# Patient Record
Sex: Male | Born: 1960 | Race: Black or African American | Hispanic: No | Smoking: Current every day smoker
Health system: Southern US, Community
[De-identification: ages and names within clinical notes are randomized; demographics above are authoritative.]

## PROBLEM LIST (undated history)

## (undated) DIAGNOSIS — F1010 Alcohol abuse, uncomplicated: Secondary | ICD-10-CM

## (undated) DIAGNOSIS — I10 Essential (primary) hypertension: Secondary | ICD-10-CM

## (undated) DIAGNOSIS — M1990 Unspecified osteoarthritis, unspecified site: Secondary | ICD-10-CM

## (undated) DIAGNOSIS — S42202A Unspecified fracture of upper end of left humerus, initial encounter for closed fracture: Secondary | ICD-10-CM

## (undated) DIAGNOSIS — T7840XA Allergy, unspecified, initial encounter: Secondary | ICD-10-CM

## (undated) DIAGNOSIS — K219 Gastro-esophageal reflux disease without esophagitis: Secondary | ICD-10-CM

## (undated) HISTORY — DX: Essential (primary) hypertension: I10

## (undated) HISTORY — PX: COLONOSCOPY: SHX174

## (undated) HISTORY — DX: Alcohol abuse, uncomplicated: F10.10

## (undated) HISTORY — DX: Allergy, unspecified, initial encounter: T78.40XA

## (undated) HISTORY — PX: MULTIPLE TOOTH EXTRACTIONS: SHX2053

---

## 2005-03-14 ENCOUNTER — Emergency Department (HOSPITAL_COMMUNITY): Admission: EM | Admit: 2005-03-14 | Discharge: 2005-03-14 | Payer: Self-pay | Admitting: Emergency Medicine

## 2010-11-18 ENCOUNTER — Inpatient Hospital Stay (HOSPITAL_COMMUNITY)
Admission: EM | Admit: 2010-11-18 | Discharge: 2010-11-20 | DRG: 552 | Disposition: A | Discharge status: Home Health | Payer: Self-pay | Attending: Internal Medicine | Admitting: Internal Medicine

## 2010-11-18 ENCOUNTER — Emergency Department (HOSPITAL_COMMUNITY): Payer: Self-pay

## 2010-11-18 DIAGNOSIS — R209 Unspecified disturbances of skin sensation: Secondary | ICD-10-CM | POA: Diagnosis present

## 2010-11-18 DIAGNOSIS — F17200 Nicotine dependence, unspecified, uncomplicated: Secondary | ICD-10-CM | POA: Diagnosis present

## 2010-11-18 DIAGNOSIS — M47812 Spondylosis without myelopathy or radiculopathy, cervical region: Principal | ICD-10-CM | POA: Diagnosis present

## 2010-11-18 DIAGNOSIS — E876 Hypokalemia: Secondary | ICD-10-CM | POA: Diagnosis present

## 2010-11-18 DIAGNOSIS — F1020 Alcohol dependence, uncomplicated: Secondary | ICD-10-CM | POA: Diagnosis present

## 2010-11-18 LAB — DIFFERENTIAL
Eosinophils Absolute: 0 10*3/uL (ref 0.0–0.7)
Eosinophils Relative: 0 % (ref 0–5)
Lymphocytes Relative: 32 % (ref 12–46)
Lymphocytes Relative: 45 % (ref 12–46)
Lymphs Abs: 1.4 10*3/uL (ref 0.7–4.0)
Lymphs Abs: 2.2 10*3/uL (ref 0.7–4.0)
Neutro Abs: 2.4 10*3/uL (ref 1.7–7.7)
Neutrophils Relative %: 49 % (ref 43–77)
Neutrophils Relative %: 63 % (ref 43–77)

## 2010-11-18 LAB — CBC
HCT: 38.3 % — ABNORMAL LOW (ref 39.0–52.0)
HCT: 39.7 % (ref 39.0–52.0)
Hemoglobin: 13.7 g/dL (ref 13.0–17.0)
MCV: 90.1 fL (ref 78.0–100.0)
MCV: 90.2 fL (ref 78.0–100.0)
Platelets: 223 10*3/uL (ref 150–400)
RBC: 4.25 MIL/uL (ref 4.22–5.81)
RBC: 4.4 MIL/uL (ref 4.22–5.81)
WBC: 4.3 10*3/uL (ref 4.0–10.5)
WBC: 4.9 10*3/uL (ref 4.0–10.5)

## 2010-11-18 LAB — BASIC METABOLIC PANEL
GFR calc non Af Amer: >60 mL/min (ref >60)
Glucose, Bld: 99 mg/dL (ref 70–99)
Potassium: 4.2 mEq/L (ref 3.5–5.1)
Sodium: 138 mEq/L (ref 135–145)

## 2010-11-19 ENCOUNTER — Inpatient Hospital Stay (HOSPITAL_COMMUNITY): Payer: Self-pay

## 2010-11-19 LAB — PROTIME-INR
INR: 0.89 (ref 0.00–1.49)
Prothrombin Time: 12.3 seconds (ref 11.6–15.2)

## 2010-11-19 LAB — RAPID URINE DRUG SCREEN, HOSP PERFORMED
Amphetamines: NOT DETECTED
Barbiturates: NOT DETECTED
Benzodiazepines: NOT DETECTED
Opiates: NOT DETECTED

## 2010-11-19 LAB — GLUCOSE, CAPILLARY
Glucose-Capillary: 95 mg/dL (ref 70–99)
Glucose-Capillary: 121 mg/dL — ABNORMAL HIGH (ref 70–99)

## 2010-11-19 LAB — COMPREHENSIVE METABOLIC PANEL
ALT: 30 U/L (ref 0–53)
AST: 65 U/L — ABNORMAL HIGH (ref 0–37)
Albumin: 3.9 g/dL (ref 3.5–5.2)
Alkaline Phosphatase: 60 U/L (ref 39–117)
BUN: 4 mg/dL — ABNORMAL LOW (ref 6–23)
CO2: 25 mEq/L (ref 19–32)
Calcium: 8.5 mg/dL (ref 8.4–10.5)
Chloride: 105 mEq/L (ref 96–112)
Creatinine, Ser: 0.81 mg/dL (ref 0.4–1.5)
GFR calc Af Amer: >60 mL/min (ref >60)
GFR calc non Af Amer: >60 mL/min (ref >60)
Glucose, Bld: 77 mg/dL (ref 70–99)
Glucose, Bld: 81 mg/dL (ref 70–99)
Sodium: 141 mEq/L (ref 135–145)
Total Bilirubin: 0.5 mg/dL (ref 0.3–1.2)
Total Bilirubin: 0.7 mg/dL (ref 0.3–1.2)
Total Protein: 6.8 g/dL (ref 6.0–8.3)

## 2010-11-19 LAB — URINALYSIS, ROUTINE W REFLEX MICROSCOPIC
Bilirubin Urine: NEGATIVE
Glucose, UA: NEGATIVE mg/dL
Hgb urine dipstick: NEGATIVE
Specific Gravity, Urine: 1.009 (ref 1.005–1.030)
pH: 5.5 (ref 5.0–8.0)

## 2010-11-19 LAB — HEMOGLOBIN A1C
Hgb A1c MFr Bld: 5.3 % (ref <5.7)
Mean Plasma Glucose: 105 mg/dL (ref <117)

## 2010-11-19 LAB — CK TOTAL AND CKMB (NOT AT ARMC)
CK, MB: 1.6 ng/mL (ref 0.3–4.0)
Relative Index: 1 (ref 0.0–2.5)

## 2010-11-19 LAB — CBC
HCT: 35.2 % — ABNORMAL LOW (ref 39.0–52.0)
Hemoglobin: 12.1 g/dL — ABNORMAL LOW (ref 13.0–17.0)
MCHC: 34.4 g/dL (ref 30.0–36.0)
RBC: 3.87 MIL/uL — ABNORMAL LOW (ref 4.22–5.81)

## 2010-11-19 LAB — TROPONIN I: Troponin I: 0.02 ng/mL (ref 0.00–0.06)

## 2010-11-19 LAB — LIPID PANEL
Cholesterol: 196 mg/dL (ref 0–200)
HDL: 133 mg/dL (ref >39)

## 2010-11-19 LAB — APTT: aPTT: 25 seconds (ref 24–37)

## 2010-11-19 MED ORDER — GADOBENATE DIMEGLUMINE 529 MG/ML IV SOLN
15.0000 mL | Freq: Once | INTRAVENOUS | Status: AC | PRN
  Administered 2010-11-19: 15 mL via INTRAVENOUS

## 2010-11-20 DIAGNOSIS — R209 Unspecified disturbances of skin sensation: Secondary | ICD-10-CM

## 2010-11-20 LAB — BASIC METABOLIC PANEL
CO2: 28 mEq/L (ref 19–32)
Calcium: 9.1 mg/dL (ref 8.4–10.5)
Chloride: 104 mEq/L (ref 96–112)
Creatinine, Ser: 0.86 mg/dL (ref 0.4–1.5)
GFR calc Af Amer: >60 mL/min (ref >60)
Glucose, Bld: 87 mg/dL (ref 70–99)
Sodium: 137 mEq/L (ref 135–145)

## 2010-11-20 LAB — GLUCOSE, CAPILLARY: Glucose-Capillary: 87 mg/dL (ref 70–99)

## 2010-11-28 NOTE — H&P (Signed)
NAME:  Dan Clark, Dan Clark              ACCOUNT NO.:  [id number]  MEDICAL RECORD NO.:  [id number]           PATIENT TYPE:  I  LOCATION:  3005                         FACILITY:  [facility]  PHYSICIAN:  Lonia Blood, M.D.      DATE OF BIRTH:  07-28-1961  DATE OF ADMISSION:  11/18/2010 DATE OF DISCHARGE:                             HISTORY & PHYSICAL   PRIMARY CARE PHYSICIAN:  Unassigned.  PRESENTING COMPLAINT:  Left upper extremity weakness.  HISTORY OF PRESENT ILLNESS:  The patient is a 50 year old gentleman with no significant past medical history but he is a heavy drinker and alcoholic who apparently was doing fine until this evening when he was at home.  He was drinking and talking to his wife.  He suddenly had weakness in his left upper extremity.  Per patient, he was unable to reach or hold his back hand.  He tried but felt numb and weak.  His wife was worried and called EMS.  He denied any symptoms of weakness in other areas, slurred speech, headache, fever, or nausea.  He denied any prior history of such.  He denied any injury.  He takes about a couple of 40s and some liquor every day,so he is a heavy alcoholic.  He also smokes at least half-a-pack a day.  The patient has not been on any medication and denied any known family history of stroke.  PAST MEDICAL HISTORY:  None.  ALLERGIES:  No known drug allergies.  MEDICATIONS:  None.  SOCIAL HISTORY:  He is married, lives with his wife.  He drinks excessively at least a couple of 40s every single day and some liquor. He also smokes about 3 cigarettes to half-pack per day.  Denied any IV drug use.  Denied any other drugs.  FAMILY HISTORY:  The patient reports hypertension running in his family and some diabetics remotely.  There is also a history of breast cancer in his mother.  REVIEW OF SYSTEMS:  All system review are negative except per HPI.  PHYSICAL EXAMINATION:  VITAL SIGNS:  Temperature 98.0 orally, blood pressure  139/100, pulse 75, respiratory rate 18, and sats 99% on room air. GENERAL:  He is awake, alert, and oriented.  He is in no acute distress. HEENT:  PERRL.  EOMI.  The patient is slightly slurred and looks slightly intoxicated. NECK:  Supple.  No JVD, no lymphadenopathy. RESPIRATORY:  He has good air entry bilaterally.  No wheezes and no rales. CARDIOVASCULAR SYSTEM:  He has S1-S2.  No murmur. ABDOMEN:  Soft, full, nontender with positive bowel sounds. EXTREMITIES:  No edema, cyanosis, or clubbing. NEUROLOGIC:  Left upper extremity seems to have isolated radial nerve area weakness.  The patient is unable to extend his wrist.  He has poor flexion but able to flex but not strong enough.  Power in those motors is 3/5.  He is able to raise his hand.  There is mild pronator drift. No corresponding lower extremity weakness.  Slight tenderness in the cervical area.  LABORATORY DATA:  Sodium 138, potassium 4.2, chloride 102, CO2 27, glucose 99, BUN 4, creatinine 0.88, and calcium 8.9.  White count is 4.3, hemoglobin 14.2, platelet count 223 with normal differentials.  ASSESSMENT:  This is a 50 year old gentleman who is a heavy alcoholic and tobacco abuser presenting with isolated left upper extremity weakness.  I suspect this is more likely to be radiculopathy.  The patient denied sleeping in the chair and the symptoms are not consistent with "Saturday night palsy" which is prevalent among alcoholics.  It seems more likely that this is an isolated radial nerve type palsy but could also be a focal cerebrovascular accident.  PLAN: 1. Left lower extremity weakness.  We will admit the patient for     workup.  I will check MRI of the brain and MRA.  I will check also     a MRI of the cervical spine to look for isolated radiculopathy.  Of     note, his reflexes are down 1+ in the left upper extremity which     may reflect radiculopathy rather than central CVA.  We will also     work him up as if  he had a CVA including 2-D echo.  We will get     PT/OT to see the patient and get Neurology consult. 2. Alcohol intoxication.  The patient is a chronic alcoholic.  I will     put him thiamine and folate.  We will watch him out for alcohol     withdrawal.  We will start with the CIWA protocol. 3. Tobacco abuse.  I will start nicotine patch on the patient and     follow him closely.     Lonia Blood, M.D.     Verlin Grills  D:  11/19/2010  T:  11/19/2010  Job:  [id number]  Electronically Signed by Lonia Blood M.D. on 11/28/2010 04:07:56 PM

## 2017-03-23 ENCOUNTER — Emergency Department (HOSPITAL_COMMUNITY)
Admission: EM | Admit: 2017-03-23 | Discharge: 2017-03-23 | Disposition: A | Discharge status: Home / Self Care | Payer: Self-pay | Attending: Emergency Medicine | Admitting: Emergency Medicine

## 2017-03-23 ENCOUNTER — Emergency Department (HOSPITAL_COMMUNITY): Payer: Self-pay

## 2017-03-23 DIAGNOSIS — Y998 Other external cause status: Secondary | ICD-10-CM | POA: Insufficient documentation

## 2017-03-23 DIAGNOSIS — W1789XA Other fall from one level to another, initial encounter: Secondary | ICD-10-CM | POA: Insufficient documentation

## 2017-03-23 DIAGNOSIS — Y939 Activity, unspecified: Secondary | ICD-10-CM | POA: Insufficient documentation

## 2017-03-23 DIAGNOSIS — M25512 Pain in left shoulder: Secondary | ICD-10-CM | POA: Insufficient documentation

## 2017-03-23 DIAGNOSIS — S42215A Unspecified nondisplaced fracture of surgical neck of left humerus, initial encounter for closed fracture: Secondary | ICD-10-CM | POA: Insufficient documentation

## 2017-03-23 DIAGNOSIS — Y929 Unspecified place or not applicable: Secondary | ICD-10-CM | POA: Insufficient documentation

## 2017-03-23 MED ORDER — MORPHINE SULFATE (PF) 4 MG/ML IV SOLN
4.0000 mg | Freq: Once | INTRAVENOUS | Status: AC
  Administered 2017-03-23: 4 mg via INTRAVENOUS
  Filled 2017-03-23: qty 1

## 2017-03-23 MED ORDER — HYDROCODONE-ACETAMINOPHEN 5-325 MG PO TABS: 1.0000 | ORAL_TABLET | Freq: Four times a day (QID) | ORAL | 0 refills | Status: DC | PRN

## 2017-03-23 NOTE — Discharge Instructions (Signed)
Please take Ibuprofen (Advil, motrin) and Tylenol (acetaminophen) to relieve your pain.  You may take up to 600 MG (3 pills) of normal strength ibuprofen every 8 hours as needed.  In between doses of ibuprofen you make take tylenol, up to 1,000 mg (two extra strength pills).  Do not take more than 3,000 mg tylenol in a 24 hour period.  Please check all medication labels as many medications such as pain and cold medications may contain tylenol.  Your prescription pain medication contains 325 mg of tylenol which you need to count in your daily allowance.  Do not drink alcohol while taking these medications.  Do not take other NSAID'S while taking ibuprofen (such as aleve or naproxen).  Please take ibuprofen with food to decrease stomach upset.  Today you received medications that may make you sleepy or impair your ability to make decisions.  For the next 24 hours please do not drive, operate heavy machinery, care for a small child with out another adult present, or perform any activities that may cause harm to you or someone else if you were to fall asleep or be impaired.   You are being prescribed a medication which may make you sleepy. Please follow up of listed precautions for at least 24 hours after taking one dose.  It is important that you do not drink while taking this medication as mixing alcohol with pain medication may slow your breathing and may make you stop breathing. Ibuprofen and tylenol should be your main pain control and the prescription medication is only for pain that is not controlled by the ibuprofen and tylenol.   While in the ED your blood pressure was high.  Please follow up with your primary care doctor or the wellness clinic for repeat evaluation as you may need medication.  High blood pressure can cause long term, potentially serious, damage if left untreated.
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
## 2017-03-23 NOTE — ED Notes (Signed)
Waiting for ortho 

## 2017-03-23 NOTE — ED Notes (Signed)
Patient transported to X-ray 

## 2017-03-23 NOTE — Progress Notes (Signed)
Orthopedic Tech Progress Note Patient Details:  Dan Clark 09/11/1960 [id number]  Ortho Devices Type of Ortho Device: Sling immobilizer Ortho Device/Splint Interventions: Application   [name] 03/23/2017, 12:21 PM
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
## 2017-03-25 ENCOUNTER — Other Ambulatory Visit: Payer: Self-pay | Admitting: Orthopedic Surgery

## 2017-03-28 ENCOUNTER — Ambulatory Visit (HOSPITAL_COMMUNITY): Payer: Self-pay

## 2017-03-28 ENCOUNTER — Ambulatory Visit (HOSPITAL_COMMUNITY): Payer: Self-pay | Admitting: Anesthesiology

## 2017-03-28 ENCOUNTER — Observation Stay (HOSPITAL_COMMUNITY): Payer: Self-pay

## 2017-03-28 ENCOUNTER — Encounter (HOSPITAL_COMMUNITY): Payer: Self-pay | Admitting: *Deleted

## 2017-03-28 ENCOUNTER — Encounter (HOSPITAL_COMMUNITY)
Admission: RE | Disposition: A | Discharge status: Home / Self Care | Payer: Self-pay | Source: Ambulatory Visit | Attending: Orthopedic Surgery

## 2017-03-28 ENCOUNTER — Observation Stay (HOSPITAL_COMMUNITY)
Admission: RE | Admit: 2017-03-28 | Discharge: 2017-03-29 | Disposition: A | Discharge status: Home / Self Care | Payer: Self-pay | Source: Ambulatory Visit | Attending: Orthopedic Surgery | Admitting: Orthopedic Surgery

## 2017-03-28 DIAGNOSIS — Z8781 Personal history of (healed) traumatic fracture: Secondary | ICD-10-CM

## 2017-03-28 DIAGNOSIS — M1990 Unspecified osteoarthritis, unspecified site: Secondary | ICD-10-CM | POA: Insufficient documentation

## 2017-03-28 DIAGNOSIS — Z419 Encounter for procedure for purposes other than remedying health state, unspecified: Secondary | ICD-10-CM

## 2017-03-28 DIAGNOSIS — Y9389 Activity, other specified: Secondary | ICD-10-CM | POA: Insufficient documentation

## 2017-03-28 DIAGNOSIS — F17200 Nicotine dependence, unspecified, uncomplicated: Secondary | ICD-10-CM | POA: Insufficient documentation

## 2017-03-28 DIAGNOSIS — K219 Gastro-esophageal reflux disease without esophagitis: Secondary | ICD-10-CM | POA: Insufficient documentation

## 2017-03-28 DIAGNOSIS — Z9889 Other specified postprocedural states: Secondary | ICD-10-CM

## 2017-03-28 DIAGNOSIS — S42292A Other displaced fracture of upper end of left humerus, initial encounter for closed fracture: Principal | ICD-10-CM | POA: Insufficient documentation

## 2017-03-28 DIAGNOSIS — S42202A Unspecified fracture of upper end of left humerus, initial encounter for closed fracture: Secondary | ICD-10-CM | POA: Diagnosis present

## 2017-03-28 HISTORY — DX: Gastro-esophageal reflux disease without esophagitis: K21.9

## 2017-03-28 HISTORY — PX: ORIF HUMERUS FRACTURE: SHX2126

## 2017-03-28 HISTORY — DX: Unspecified osteoarthritis, unspecified site: M19.90

## 2017-03-28 HISTORY — DX: Unspecified fracture of upper end of left humerus, initial encounter for closed fracture: S42.202A

## 2017-03-28 LAB — CBC
HCT: 33.9 % — ABNORMAL LOW (ref 39.0–52.0)
HEMOGLOBIN: 11.6 g/dL — AB (ref 13.0–17.0)
MCH: 31.6 pg (ref 26.0–34.0)
MCHC: 34.2 g/dL (ref 30.0–36.0)
MCV: 92.4 fL (ref 78.0–100.0)
Platelets: 268 10*3/uL (ref 150–400)
RBC: 3.67 MIL/uL — AB (ref 4.22–5.81)
RDW: 13.4 % (ref 11.5–15.5)
WBC: 6.9 10*3/uL (ref 4.0–10.5)

## 2017-03-28 LAB — BASIC METABOLIC PANEL
ANION GAP: 11 (ref 5–15)
BUN: <5 mg/dL — ABNORMAL LOW (ref 6–20)
CO2: 28 mmol/L (ref 22–32)
Calcium: 9.2 mg/dL (ref 8.9–10.3)
Chloride: 96 mmol/L — ABNORMAL LOW (ref 101–111)
Creatinine, Ser: 0.93 mg/dL (ref 0.61–1.24)
Glucose, Bld: 110 mg/dL — ABNORMAL HIGH (ref 65–99)
POTASSIUM: 3.2 mmol/L — AB (ref 3.5–5.1)
SODIUM: 135 mmol/L (ref 135–145)

## 2017-03-28 LAB — SURGICAL PCR SCREEN
MRSA, PCR: NEGATIVE
STAPHYLOCOCCUS AUREUS: NEGATIVE

## 2017-03-28 SURGERY — OPEN REDUCTION INTERNAL FIXATION (ORIF) PROXIMAL HUMERUS FRACTURE
Anesthesia: General | Laterality: Left

## 2017-03-28 MED ORDER — ZOLPIDEM TARTRATE 5 MG PO TABS
5.0000 mg | ORAL_TABLET | Freq: Every evening | ORAL | Status: DC | PRN
  Administered 2017-03-28: 5 mg via ORAL
  Filled 2017-03-28: qty 1

## 2017-03-28 MED ORDER — MENTHOL 3 MG MT LOZG: 1.0000 | LOZENGE | OROMUCOSAL | Status: DC | PRN

## 2017-03-28 MED ORDER — FENTANYL CITRATE (PF) 250 MCG/5ML IJ SOLN
INTRAMUSCULAR | Status: AC
  Filled 2017-03-28: qty 5

## 2017-03-28 MED ORDER — PHENYLEPHRINE 40 MCG/ML (10ML) SYRINGE FOR IV PUSH (FOR BLOOD PRESSURE SUPPORT)
PREFILLED_SYRINGE | INTRAVENOUS | Status: AC
  Filled 2017-03-28: qty 10

## 2017-03-28 MED ORDER — OXYCODONE HCL 5 MG PO TABS
5.0000 mg | ORAL_TABLET | ORAL | Status: DC | PRN
  Administered 2017-03-28 – 2017-03-29 (×4): 10 mg via ORAL
  Filled 2017-03-28 (×4): qty 2

## 2017-03-28 MED ORDER — METOCLOPRAMIDE HCL 5 MG/ML IJ SOLN: 5.0000 mg | Freq: Three times a day (TID) | INTRAMUSCULAR | Status: DC | PRN

## 2017-03-28 MED ORDER — ONDANSETRON HCL 4 MG/2ML IJ SOLN
INTRAMUSCULAR | Status: AC
  Filled 2017-03-28: qty 2

## 2017-03-28 MED ORDER — CEFAZOLIN SODIUM-DEXTROSE 1-4 GM/50ML-% IV SOLN
1.0000 g | Freq: Four times a day (QID) | INTRAVENOUS | Status: AC
  Administered 2017-03-28 – 2017-03-29 (×3): 1 g via INTRAVENOUS
  Filled 2017-03-28 (×3): qty 50

## 2017-03-28 MED ORDER — PHENYLEPHRINE HCL 10 MG/ML IJ SOLN
INTRAMUSCULAR | Status: DC | PRN
  Administered 2017-03-28 (×7): 80 ug via INTRAVENOUS

## 2017-03-28 MED ORDER — ONDANSETRON HCL 4 MG/2ML IJ SOLN: 4.0000 mg | Freq: Four times a day (QID) | INTRAMUSCULAR | Status: DC | PRN

## 2017-03-28 MED ORDER — DIPHENHYDRAMINE HCL 12.5 MG/5ML PO ELIX: 12.5000 mg | ORAL_SOLUTION | ORAL | Status: DC | PRN

## 2017-03-28 MED ORDER — LACTATED RINGERS IV SOLN
INTRAVENOUS | Status: DC
  Administered 2017-03-28: 13:00:00 via INTRAVENOUS

## 2017-03-28 MED ORDER — FENTANYL CITRATE (PF) 100 MCG/2ML IJ SOLN
INTRAMUSCULAR | Status: DC | PRN
  Administered 2017-03-28: 100 ug via INTRAVENOUS
  Administered 2017-03-28 (×3): 50 ug via INTRAVENOUS

## 2017-03-28 MED ORDER — HYDROMORPHONE HCL 1 MG/ML IJ SOLN
0.5000 mg | INTRAMUSCULAR | Status: DC | PRN
  Administered 2017-03-29 (×2): 0.5 mg via INTRAVENOUS
  Filled 2017-03-28 (×2): qty 1

## 2017-03-28 MED ORDER — BACLOFEN 10 MG PO TABS: 10.0000 mg | ORAL_TABLET | Freq: Three times a day (TID) | ORAL | 0 refills | Status: DC

## 2017-03-28 MED ORDER — ROCURONIUM BROMIDE 100 MG/10ML IV SOLN
INTRAVENOUS | Status: DC | PRN
  Administered 2017-03-28: 50 mg via INTRAVENOUS

## 2017-03-28 MED ORDER — SENNA-DOCUSATE SODIUM 8.6-50 MG PO TABS: 2.0000 | ORAL_TABLET | Freq: Every day | ORAL | 1 refills | Status: DC

## 2017-03-28 MED ORDER — POLYETHYLENE GLYCOL 3350 17 G PO PACK: 17.0000 g | PACK | Freq: Every day | ORAL | Status: DC | PRN

## 2017-03-28 MED ORDER — LACTATED RINGERS IV SOLN
INTRAVENOUS | Status: DC | PRN
  Administered 2017-03-28 (×3): via INTRAVENOUS

## 2017-03-28 MED ORDER — OXYCODONE HCL 5 MG PO TABS: 5.0000 mg | ORAL_TABLET | Freq: Once | ORAL | Status: DC | PRN

## 2017-03-28 MED ORDER — BISACODYL 10 MG RE SUPP: 10.0000 mg | Freq: Every day | RECTAL | Status: DC | PRN

## 2017-03-28 MED ORDER — MIDAZOLAM HCL 2 MG/2ML IJ SOLN
INTRAMUSCULAR | Status: AC
  Administered 2017-03-28: 2 mg via INTRAVENOUS
  Filled 2017-03-28: qty 2

## 2017-03-28 MED ORDER — HYDROMORPHONE HCL 1 MG/ML IJ SOLN
0.2500 mg | INTRAMUSCULAR | Status: DC | PRN
  Administered 2017-03-28 (×2): 0.5 mg via INTRAVENOUS

## 2017-03-28 MED ORDER — FENTANYL CITRATE (PF) 100 MCG/2ML IJ SOLN
INTRAMUSCULAR | Status: AC
  Administered 2017-03-28: 50 ug via INTRAVENOUS
  Filled 2017-03-28: qty 2

## 2017-03-28 MED ORDER — BUPIVACAINE-EPINEPHRINE (PF) 0.5% -1:200000 IJ SOLN
INTRAMUSCULAR | Status: DC | PRN
  Administered 2017-03-28: 30 mL via PERINEURAL

## 2017-03-28 MED ORDER — LABETALOL HCL 5 MG/ML IV SOLN
INTRAVENOUS | Status: AC
  Filled 2017-03-28: qty 4

## 2017-03-28 MED ORDER — ONDANSETRON HCL 4 MG/2ML IJ SOLN
INTRAMUSCULAR | Status: DC | PRN
  Administered 2017-03-28: 4 mg via INTRAVENOUS

## 2017-03-28 MED ORDER — 0.9 % SODIUM CHLORIDE (POUR BTL) OPTIME
TOPICAL | Status: DC | PRN
  Administered 2017-03-28: 1000 mL

## 2017-03-28 MED ORDER — METOCLOPRAMIDE HCL 5 MG PO TABS: 5.0000 mg | ORAL_TABLET | Freq: Three times a day (TID) | ORAL | Status: DC | PRN

## 2017-03-28 MED ORDER — POVIDONE-IODINE 10 % EX SWAB: 2.0000 "application " | Freq: Once | CUTANEOUS | Status: DC

## 2017-03-28 MED ORDER — CEFAZOLIN SODIUM-DEXTROSE 2-4 GM/100ML-% IV SOLN
2.0000 g | INTRAVENOUS | Status: AC
  Administered 2017-03-28: 2 g via INTRAVENOUS
  Filled 2017-03-28: qty 100

## 2017-03-28 MED ORDER — LIDOCAINE HCL (CARDIAC) 20 MG/ML IV SOLN
INTRAVENOUS | Status: DC | PRN
  Administered 2017-03-28: 60 mg via INTRAVENOUS

## 2017-03-28 MED ORDER — HYDROMORPHONE HCL 1 MG/ML IJ SOLN
INTRAMUSCULAR | Status: AC
  Filled 2017-03-28: qty 1

## 2017-03-28 MED ORDER — PROPOFOL 10 MG/ML IV BOLUS
INTRAVENOUS | Status: DC | PRN
  Administered 2017-03-28: 150 mg via INTRAVENOUS

## 2017-03-28 MED ORDER — ACETAMINOPHEN 325 MG PO TABS: 650.0000 mg | ORAL_TABLET | Freq: Four times a day (QID) | ORAL | Status: DC | PRN

## 2017-03-28 MED ORDER — MAGNESIUM CITRATE PO SOLN: 1.0000 | Freq: Once | ORAL | Status: DC | PRN

## 2017-03-28 MED ORDER — PHENOL 1.4 % MT LIQD: 1.0000 | OROMUCOSAL | Status: DC | PRN

## 2017-03-28 MED ORDER — DOCUSATE SODIUM 100 MG PO CAPS
100.0000 mg | ORAL_CAPSULE | Freq: Two times a day (BID) | ORAL | Status: DC
  Administered 2017-03-28 – 2017-03-29 (×2): 100 mg via ORAL
  Filled 2017-03-28 (×2): qty 1

## 2017-03-28 MED ORDER — OXYCODONE HCL 5 MG PO TABS: 5.0000 mg | ORAL_TABLET | ORAL | 0 refills | Status: DC | PRN

## 2017-03-28 MED ORDER — MIDAZOLAM HCL 2 MG/2ML IJ SOLN
2.0000 mg | Freq: Once | INTRAMUSCULAR | Status: AC
  Administered 2017-03-28: 2 mg via INTRAVENOUS

## 2017-03-28 MED ORDER — METHOCARBAMOL 500 MG PO TABS
ORAL_TABLET | ORAL | Status: AC
  Filled 2017-03-28: qty 1

## 2017-03-28 MED ORDER — FENTANYL CITRATE (PF) 100 MCG/2ML IJ SOLN
50.0000 ug | Freq: Once | INTRAMUSCULAR | Status: AC
  Administered 2017-03-28: 50 ug via INTRAVENOUS

## 2017-03-28 MED ORDER — METHOCARBAMOL 500 MG PO TABS
500.0000 mg | ORAL_TABLET | Freq: Four times a day (QID) | ORAL | Status: DC | PRN
  Administered 2017-03-28 – 2017-03-29 (×3): 500 mg via ORAL
  Filled 2017-03-28 (×2): qty 1

## 2017-03-28 MED ORDER — OXYCODONE HCL 5 MG/5ML PO SOLN: 5.0000 mg | Freq: Once | ORAL | Status: DC | PRN

## 2017-03-28 MED ORDER — SENNA 8.6 MG PO TABS
1.0000 | ORAL_TABLET | Freq: Two times a day (BID) | ORAL | Status: DC
  Administered 2017-03-28 – 2017-03-29 (×2): 8.6 mg via ORAL
  Filled 2017-03-28 (×2): qty 1

## 2017-03-28 MED ORDER — SUGAMMADEX SODIUM 200 MG/2ML IV SOLN
INTRAVENOUS | Status: AC
  Filled 2017-03-28: qty 2

## 2017-03-28 MED ORDER — ACETAMINOPHEN 650 MG RE SUPP: 650.0000 mg | Freq: Four times a day (QID) | RECTAL | Status: DC | PRN

## 2017-03-28 MED ORDER — ALUM & MAG HYDROXIDE-SIMETH 200-200-20 MG/5ML PO SUSP: 30.0000 mL | ORAL | Status: DC | PRN

## 2017-03-28 MED ORDER — ONDANSETRON HCL 4 MG PO TABS: 4.0000 mg | ORAL_TABLET | Freq: Four times a day (QID) | ORAL | Status: DC | PRN

## 2017-03-28 MED ORDER — POTASSIUM CHLORIDE IN NACL 20-0.45 MEQ/L-% IV SOLN
INTRAVENOUS | Status: DC
  Administered 2017-03-28 – 2017-03-29 (×2): via INTRAVENOUS
  Filled 2017-03-28 (×2): qty 1000

## 2017-03-28 MED ORDER — LABETALOL HCL 5 MG/ML IV SOLN
5.0000 mg | Freq: Once | INTRAVENOUS | Status: DC
  Administered 2017-03-28: 5 mg via INTRAVENOUS

## 2017-03-28 MED ORDER — SUGAMMADEX SODIUM 200 MG/2ML IV SOLN
INTRAVENOUS | Status: DC | PRN
  Administered 2017-03-28: 125.2 mg via INTRAVENOUS

## 2017-03-28 MED ORDER — METHOCARBAMOL 1000 MG/10ML IJ SOLN
500.0000 mg | Freq: Four times a day (QID) | INTRAVENOUS | Status: DC | PRN
  Filled 2017-03-28: qty 5

## 2017-03-28 MED ORDER — CHLORHEXIDINE GLUCONATE 4 % EX LIQD: 60.0000 mL | Freq: Once | CUTANEOUS | Status: DC

## 2017-03-28 SURGICAL SUPPLY — 70 items
APL SKNCLS STERI-STRIP NONHPOA (GAUZE/BANDAGES/DRESSINGS) ×1
BENZOIN TINCTURE PRP APPL 2/3 (GAUZE/BANDAGES/DRESSINGS) ×3 IMPLANT
BIT DRILL 3.2 (BIT) ×3
BIT DRILL 3.2XCALB NS DISP (BIT) ×1 IMPLANT
BIT DRILL CALIBRATED 2.7 (BIT) ×2 IMPLANT
BIT DRILL CALIBRATED 2.7MM (BIT) ×1
BIT DRL 3.2XCALB NS DISP (BIT) ×1
CLEANER TIP ELECTROSURG 2X2 (MISCELLANEOUS) IMPLANT
CLOSURE STERI-STRIP 1/2X4 (GAUZE/BANDAGES/DRESSINGS) ×1
CLSR STERI-STRIP ANTIMIC 1/2X4 (GAUZE/BANDAGES/DRESSINGS) ×2 IMPLANT
COVER SURGICAL LIGHT HANDLE (MISCELLANEOUS) ×3 IMPLANT
DRAPE C-ARM 42X72 X-RAY (DRAPES) ×3 IMPLANT
DRAPE IMP U-DRAPE 54X76 (DRAPES) ×3 IMPLANT
DRAPE INCISE IOBAN 66X45 STRL (DRAPES) ×3 IMPLANT
DRAPE U-SHAPE 47X51 STRL (DRAPES) ×3 IMPLANT
DRSG AQUACEL AG ADV 3.5X10 (GAUZE/BANDAGES/DRESSINGS) ×3 IMPLANT
DRSG MEPILEX BORDER 4X8 (GAUZE/BANDAGES/DRESSINGS) ×3 IMPLANT
DURAPREP 26ML APPLICATOR (WOUND CARE) ×9 IMPLANT
ELECT REM PT RETURN 9FT ADLT (ELECTROSURGICAL)
ELECTRODE REM PT RTRN 9FT ADLT (ELECTROSURGICAL) IMPLANT
GLOVE BIOGEL PI IND STRL 7.0 (GLOVE) ×1 IMPLANT
GLOVE BIOGEL PI INDICATOR 7.0 (GLOVE) ×2
GLOVE BIOGEL PI ORTHO PRO SZ8 (GLOVE) ×2
GLOVE ORTHO TXT STRL SZ7.5 (GLOVE) ×6 IMPLANT
GLOVE PI ORTHO PRO STRL SZ8 (GLOVE) ×1 IMPLANT
GLOVE SURG ORTHO 8.0 STRL STRW (GLOVE) ×6 IMPLANT
GLOVE SURG SS PI 6.5 STRL IVOR (GLOVE) ×3 IMPLANT
GOWN STRL REUS W/ TWL LRG LVL3 (GOWN DISPOSABLE) IMPLANT
GOWN STRL REUS W/TWL LRG LVL3 (GOWN DISPOSABLE)
K-WIRE 2X5 SS THRDED S3 (WIRE) ×6
KIT BASIN OR (CUSTOM PROCEDURE TRAY) ×3 IMPLANT
KIT ROOM TURNOVER OR (KITS) ×3 IMPLANT
KWIRE 2X5 SS THRDED S3 (WIRE) ×2 IMPLANT
MANIFOLD NEPTUNE II (INSTRUMENTS) ×3 IMPLANT
NEEDLE HYPO 25GX1X1/2 BEV (NEEDLE) IMPLANT
NS IRRIG 1000ML POUR BTL (IV SOLUTION) ×3 IMPLANT
PACK SHOULDER (CUSTOM PROCEDURE TRAY) ×3 IMPLANT
PACK UNIVERSAL I (CUSTOM PROCEDURE TRAY) ×3 IMPLANT
PAD ARMBOARD 7.5X6 YLW CONV (MISCELLANEOUS) ×6 IMPLANT
PEG LOCKING 3.2X 60MM (Peg) ×3 IMPLANT
PEG LOCKING 3.2X38 (Screw) ×6 IMPLANT
PEG LOCKING 3.2X40 (Peg) ×3 IMPLANT
PEG LOCKING 3.2X42 (Screw) ×3 IMPLANT
PEG LOCKING 3.2X48 (Peg) ×6 IMPLANT
PEG LOCKING 3.2X50 (Screw) ×3 IMPLANT
PEG LOCKING 3.2X52 (Peg) ×3 IMPLANT
PEG LOCKING 3.2X58MM (Peg) ×3 IMPLANT
PLATE HUMERUS LP PROX L 3H (Plate) ×3 IMPLANT
SCREW LOW PROF TIS 3.5X28MM (Screw) ×6 IMPLANT
SCREW LP NL T15 3.5X26 (Screw) ×3 IMPLANT
SCREW PEG LOCK 3.2X30MM (Screw) ×3 IMPLANT
SLEEVE MEASURING 3.2 (BIT) ×3 IMPLANT
SLING ARM IMMOBILIZER LRG (SOFTGOODS) ×3 IMPLANT
SPONGE LAP 4X18 X RAY DECT (DISPOSABLE) IMPLANT
STAPLER VISISTAT 35W (STAPLE) IMPLANT
SUCTION FRAZIER HANDLE 10FR (MISCELLANEOUS) ×2
SUCTION TUBE FRAZIER 10FR DISP (MISCELLANEOUS) ×1 IMPLANT
SUPPORT WRAP ARM LG (MISCELLANEOUS) ×3 IMPLANT
SUT FIBERWIRE #2 38 T-5 BLUE (SUTURE) ×6
SUT MAXBRAID (SUTURE) ×3 IMPLANT
SUT VIC AB 0 CTB1 27 (SUTURE) ×3 IMPLANT
SUT VIC AB 2-0 CT1 27 (SUTURE) ×3
SUT VIC AB 2-0 CT1 TAPERPNT 27 (SUTURE) ×1 IMPLANT
SUT VIC AB 3-0 FS2 27 (SUTURE) ×3 IMPLANT
SUTURE FIBERWR #2 38 T-5 BLUE (SUTURE) ×2 IMPLANT
SYR BULB IRRIGATION 50ML (SYRINGE) ×3 IMPLANT
SYR CONTROL 10ML LL (SYRINGE) IMPLANT
TOWEL OR 17X24 6PK STRL BLUE (TOWEL DISPOSABLE) ×3 IMPLANT
TOWEL OR 17X26 10 PK STRL BLUE (TOWEL DISPOSABLE) ×3 IMPLANT
WATER STERILE IRR 1000ML POUR (IV SOLUTION) ×3 IMPLANT

## 2017-03-28 NOTE — Op Note (Signed)
03/28/2017  6:09 PM  PATIENT:  Dan Clark    PRE-OPERATIVE DIAGNOSIS:  Displaced acute left proximal humerus fracture  POST-OPERATIVE DIAGNOSIS:  Same  PROCEDURE:  OPEN REDUCTION INTERNAL FIXATION (ORIF) LEFT PROXIMAL HUMERUS FRACTURE  SURGEON:  Eulas PostLANDAU,Yitzhak Awan P, MD  PHYSICIAN ASSISTANT: Janace LittenBrandon Parry, OPA-C, present and scrubbed throughout the case, critical for completion in a timely fashion, and for retraction, instrumentation, and closure.  ANESTHESIA:   General  ESTIMATED BLOOD LOSS: 175 mL  PREOPERATIVE INDICATIONS:  Dan Clark is a  56 y.o. male with a diagnosis of LEFT HUMERUS FRACTURE, UPPER END S42.209A who elected for surgical management.    The risks benefits and alternatives were discussed with the patient including but not limited to the risks of nonoperative treatment, versus surgical intervention including infection, bleeding, nerve injury, malunion, nonunion, the need for revision surgery, hardware prominence, hardware failure, the need for hardware removal, blood clots, cardiopulmonary complications, conversion to arthroplasty, morbidity, mortality, among others, and they were willing to proceed.  Predicted outcome is good, although there will be at least a six to nine month expected recovery.   OPERATIVE IMPLANTS: Biomet ALPS proximal humerus locking plate with multiple smooth locking pegs and 3 distal bicortical screws.  OPERATIVE FINDINGS: Displaced proximal humerus fracture. The rotator cuff did have some damage superolaterally, I suspect this was from the fracture, and I did secure the rotator cuff back to the tuberosity with FiberWire suture through the plate.  UNIQUE ASPECTS OF THE CASE:  There was a significant amount of translation of the shaft on the head, and I was ultimately able to reduce it, and the bone quality was actually reasonably good, interestingly the pegs were fairly long, in part because I needed to get fixation into the head across the  fracture, and the translation added a couple of extra millimeters, however it looked well aligned on the lateral.  OPERATIVE PROCEDURE: The patient was brought to the operating room and placed in the supine position. General anesthesia was administered. IV antibiotics were given. He was placed in the beach chair position. All bony prominences were padded. The upper extremity was prepped and draped in usual sterile fashion. Deltopectoral incision was performed.  I exposed the fracture site, and placed deep retractors. I did not tenotomize the biceps tendon. This was left in place.  I placed supraspinatus and subscapularis stitches, as well as an additional stitch from the infraspinatus to the supraspinatus which secured a portion of rotator cuff which may have had a small chronic tear. I then reduced the head onto the shaft. This was maintained in satisfactory position.  I applied the plate and secured it into the sliding hole first. I confirmed position of the reduction and the plate with C-arm, and I placed a total of 2 guidewires into the appropriate position in the head. I was satisfied that the plate was distal appropriately, and then secured the plate proximally with smooth pegs, taking care to prevent penetration into the arch articular surface, using C-arm, as well as manual feel with a depth gauge.   I then secured the plate distally using another 2 cortical screws. I then passed the FiberWire sutures from the subscapularis and supraspinatus through the plate and secured the tuberosities. Once complete fixation and reduction of been achieved, took final C-arm pictures, and irrigated the wounds copiously, and repaired the deltopectoral interval with Vicryl followed by Vicryl for the subcutaneous tissue with Monocryl and Steri-Strips for the skin. He was placed in a sling.  He had a preoperative regional block as well. He tolerated the procedure well with no complications.

## 2017-03-28 NOTE — Discharge Instructions (Signed)

## 2017-03-28 NOTE — Progress Notes (Signed)
Dr. Mal AmabileBrock notified of patient's BP.  Orders received to give 5mg  IV labetalol.  Will continue to monitor.

## 2017-03-28 NOTE — Anesthesia Postprocedure Evaluation (Signed)
Anesthesia Post Note  Patient: Dan Clark  Procedure(s) Performed: Procedure(s) (LRB): OPEN REDUCTION INTERNAL FIXATION (ORIF) LEFT PROXIMAL HUMERUS FRACTURE (Left)     Patient location during evaluation: PACU Anesthesia Type: General Level of consciousness: awake and alert Pain management: pain level controlled Vital Signs Assessment: post-procedure vital signs reviewed and stable Respiratory status: spontaneous breathing, nonlabored ventilation, respiratory function stable and patient connected to nasal cannula oxygen Cardiovascular status: blood pressure returned to baseline and stable Postop Assessment: no signs of nausea or vomiting Anesthetic complications: no    Last Vitals:  Vitals:   03/28/17 1945 03/28/17 2000  BP: (!) 161/93 (!) 157/94  Pulse: 75 75  Resp: 14 14  Temp:  36.5 C  SpO2: 93% 94%    Last Pain:  Vitals:   03/28/17 2000  TempSrc:   PainSc: 3                  Beryle Lathehomas E Brock

## 2017-03-28 NOTE — Anesthesia Procedure Notes (Signed)
Anesthesia Regional Block: Interscalene brachial plexus block   Pre-Anesthetic Checklist: ,, timeout performed, Correct Patient, Correct Site, Correct Laterality, Correct Procedure, Correct Position, site marked, Risks and benefits discussed,  Surgical consent,  Pre-op evaluation,  At surgeon's request and post-op pain management  Laterality: Left  Prep: chloraprep       Needles:  Injection technique: Single-shot  Needle Type: Echogenic Stimulator Needle     Needle Length: 5cm  Needle Gauge: 22     Additional Needles:   Procedures: ultrasound guided, nerve stimulator,,,,,,   Nerve Stimulator or Paresthesia:  Response: biceps flexion, 0.45 mA,   Additional Responses:   Narrative:  Start time: 03/28/2017 3:06 PM End time: 03/28/2017 3:16 PM Injection made incrementally with aspirations every 5 mL.  Performed by: Personally  Anesthesiologist: Taryll Reichenberger  Additional Notes: Functioning IV was confirmed and monitors were applied.  A 50mm 22ga Arrow echogenic stimulator needle was used. Sterile prep and drape,hand hygiene and sterile gloves were used.  Negative aspiration and negative test dose prior to incremental administration of local anesthetic. The patient tolerated the procedure well.  Ultrasound guidance: relevent anatomy identified, needle position confirmed, local anesthetic spread visualized around nerve(s), vascular puncture avoided.  Image printed for medical record.

## 2017-03-28 NOTE — H&P (Signed)
PREOPERATIVE H&P  Chief Complaint: LEFT HUMERUS FRACTURE, UPPER END S42.209A  HPI: Dan Clark is a 56 y.o. male who presents for preoperative history and physical with a diagnosis of LEFT HUMERUS FRACTURE, UPPER END S42.209A. Symptoms are rated as moderate to severe, and have been worsening.  This is significantly impairing activities of daily living.  He has elected for surgical management. This happened after he fell out of a truck.  He denies any other injuries, worse with movement, better with rest.    Past Medical History:  Diagnosis Date  . Arthritis   . GERD (gastroesophageal reflux disease)    Past Surgical History:  Procedure Laterality Date  . MULTIPLE TOOTH EXTRACTIONS     Social History   Social History  . Marital status: Married    Spouse name: N/A  . Number of children: N/A  . Years of education: N/A   Social History Main Topics  . Smoking status: Current Every Day Smoker    Years: 20.00    Types: Cigars  . Smokeless tobacco: Never Used  . Alcohol use 8.4 oz/week    14 Cans of beer per week  . Drug use: No  . Sexual activity: Not Asked   Other Topics Concern  . None   Social History Narrative  . None   History reviewed. No pertinent family history. Allergies  Allergen Reactions  . No Known Allergies    Prior to Admission medications   Medication Sig Start Date End Date Taking? Authorizing Provider  HYDROcodone-acetaminophen (NORCO/VICODIN) 5-325 MG tablet Take 1-2 tablets by mouth every 6 (six) hours as needed for severe pain. 03/23/17  Yes Cristina GongHammond, Elizabeth W, PA-C     Positive ROS: All other systems have been reviewed and were otherwise negative with the exception of those mentioned in the HPI and as above.  Physical Exam: General: Alert, no acute distress Cardiovascular: No pedal edema Respiratory: No cyanosis, no use of accessory musculature GI: No organomegaly, abdomen is soft and non-tender Skin: No lesions in the area of chief  complaint Neurologic: Sensation intact distally Psychiatric: Patient is competent for consent with normal mood and affect Lymphatic: No axillary or cervical lymphadenopathy  MUSCULOSKELETAL: left shoulder sensation intact throughout the upper extremity, all fingers flex extend and abduct. No skin breaks.  Positive ecchymosis and swelling.    Assessment: LEFT HUMERUS FRACTURE, UPPER END S42.209A   Plan: Plan for Procedure(s): OPEN REDUCTION INTERNAL FIXATION (ORIF) LEFT PROXIMAL HUMERUS FRACTURE  The risks benefits and alternatives were discussed with the patient including but not limited to the risks of nonoperative treatment, versus surgical intervention including infection, bleeding, nerve injury, malunion, nonunion, the need for revision surgery, hardware prominence, hardware failure, the need for hardware removal, blood clots, cardiopulmonary complications, morbidity, mortality, among others, and they were willing to proceed.     Eulas PostLANDAU,Ibn Stief P, MD Cell [phone]   03/28/2017 3:01 PM

## 2017-03-28 NOTE — Anesthesia Procedure Notes (Signed)
Procedure Name: Intubation Date/Time: 03/28/2017 4:11 PM Performed by: Manus Gunning, Audra Kagel J Pre-anesthesia Checklist: Patient identified, Emergency Drugs available, Suction available, Patient being monitored and Timeout performed Patient Re-evaluated:Patient Re-evaluated prior to induction Oxygen Delivery Method: Circle system utilized Preoxygenation: Pre-oxygenation with 100% oxygen Induction Type: IV induction Ventilation: Mask ventilation without difficulty Laryngoscope Size: Mac and 4 Grade View: Grade I Tube type: Oral Tube size: 7.5 mm Number of attempts: 1 Airway Equipment and Method: Stylet Placement Confirmation: ETT inserted through vocal cords under direct vision,  positive ETCO2 and breath sounds checked- equal and bilateral Secured at: 21 cm Tube secured with: Tape Dental Injury: Teeth and Oropharynx as per pre-operative assessment

## 2017-03-28 NOTE — Anesthesia Preprocedure Evaluation (Signed)
Anesthesia Evaluation  Patient identified by MRN, date of birth, ID band Patient awake    Reviewed: Allergy & Precautions, H&P , NPO status , Patient's Chart, lab work & pertinent test results  Airway Mallampati: II   Neck ROM: full    Dental   Pulmonary Current Smoker,    breath sounds clear to auscultation       Cardiovascular negative cardio ROS   Rhythm:regular Rate:Normal     Neuro/Psych    GI/Hepatic GERD  ,  Endo/Other    Renal/GU      Musculoskeletal  (+) Arthritis ,   Abdominal   Peds  Hematology   Anesthesia Other Findings   Reproductive/Obstetrics                             Anesthesia Physical Anesthesia Plan  ASA: II  Anesthesia Plan: General   Post-op Pain Management:  Regional for Post-op pain   Induction: Intravenous  PONV Risk Score and Plan: 1 and Ondansetron, Dexamethasone, Midazolam and Treatment may vary due to age or medical condition  Airway Management Planned: Oral ETT  Additional Equipment:   Intra-op Plan:   Post-operative Plan: Extubation in OR  Informed Consent: I have reviewed the patients History and Physical, chart, labs and discussed the procedure including the risks, benefits and alternatives for the proposed anesthesia with the patient or authorized representative who has indicated his/her understanding and acceptance.     Plan Discussed with: CRNA, Anesthesiologist and Surgeon  Anesthesia Plan Comments:         Anesthesia Quick Evaluation

## 2017-03-28 NOTE — Transfer of Care (Signed)
Immediate Anesthesia Transfer of Care Note  Patient: Lynford HumphreyStover L Olesky  Procedure(s) Performed: Procedure(s): OPEN REDUCTION INTERNAL FIXATION (ORIF) LEFT PROXIMAL HUMERUS FRACTURE (Left)  Patient Location: PACU  Anesthesia Type:General  Level of Consciousness: awake  Airway & Oxygen Therapy: Patient Spontanous Breathing and Patient connected to nasal cannula oxygen  Post-op Assessment: Report given to RN and Post -op Vital signs reviewed and stable  Post vital signs: Reviewed and stable  Last Vitals:  Vitals:   03/28/17 1150  BP: (!) 166/87  Pulse: 88  Resp: 18  Temp: 36.8 C  SpO2: 100%    Last Pain:  Vitals:   03/28/17 1211  TempSrc:   PainSc: 10-Worst pain ever      Patients Stated Pain Goal: 1 (03/28/17 1211)  Complications: No apparent anesthesia complications

## 2017-03-29 ENCOUNTER — Encounter (HOSPITAL_COMMUNITY): Payer: Self-pay | Admitting: General Practice

## 2017-03-29 LAB — BASIC METABOLIC PANEL
Anion gap: 8 (ref 5–15)
CALCIUM: 8.5 mg/dL — AB (ref 8.9–10.3)
CO2: 29 mmol/L (ref 22–32)
CREATININE: 0.98 mg/dL (ref 0.61–1.24)
Chloride: 97 mmol/L — ABNORMAL LOW (ref 101–111)
GFR calc Af Amer: >60 mL/min (ref >60)
GLUCOSE: 117 mg/dL — AB (ref 65–99)
POTASSIUM: 3.7 mmol/L (ref 3.5–5.1)
SODIUM: 134 mmol/L — AB (ref 135–145)

## 2017-03-29 LAB — CBC
HCT: 28.9 % — ABNORMAL LOW (ref 39.0–52.0)
Hemoglobin: 9.7 g/dL — ABNORMAL LOW (ref 13.0–17.0)
MCH: 31.4 pg (ref 26.0–34.0)
MCHC: 33.6 g/dL (ref 30.0–36.0)
MCV: 93.5 fL (ref 78.0–100.0)
PLATELETS: 239 10*3/uL (ref 150–400)
RBC: 3.09 MIL/uL — ABNORMAL LOW (ref 4.22–5.81)
RDW: 13.7 % (ref 11.5–15.5)
WBC: 7.6 10*3/uL (ref 4.0–10.5)

## 2017-03-29 NOTE — Evaluation (Addendum)
Physical Therapy Evaluation Patient Details Name: Dan Clark MRN: [id number] DOB: 1960/10/23 Today's Date: 03/29/2017   History of Present Illness  Pt is 56 y.o. male admitted with L humerus fx; now s/p L humeral ORIF on 03/28/17. Pertinent PMH includes arthritis, GERD.    Clinical Impression  Patient evaluated by Physical Therapy with no further acute PT needs identified at this time. All education has been completed and the patient has no further questions. PT is signing off. Thank you for this referral.     Follow Up Recommendations No PT follow up    Equipment Recommendations  None recommended by PT    Recommendations for Other Services OT consult     Precautions / Restrictions Precautions Precautions: None Restrictions Weight Bearing Restrictions: Yes LUE Weight Bearing: Non weight bearing      Mobility  Bed Mobility Overal bed mobility: Modified Independent Bed Mobility: Supine to Sit     Supine to sit: HOB elevated     General bed mobility comments: Cues for technique with LUE NWB precautions, reliant on bed rail to pull into sitting  Transfers Overall transfer level: Needs assistance Equipment used: None Transfers: Sit to/from Stand Sit to Stand: Modified independent (Device/Increase time)         General transfer comment: Able to push off with RUE into standing  Ambulation/Gait Ambulation/Gait assistance: Supervision Ambulation Distance (Feet): 10 Feet Assistive device: None Gait Pattern/deviations: Step-through pattern;Decreased stride length Gait velocity: Decreased Gait velocity interpretation: <1.8 ft/sec, indicative of risk for recurrent falls General Gait Details: Amb in room with superivision for safety since first time OOB; no instability, no physical assist needed.  Stairs            Wheelchair Mobility    Modified Rankin (Stroke Patients Only)       Balance Overall balance assessment: Modified Independent          Standing balance support: No upper extremity supported;During functional activity Standing balance-Leahy Scale: Good                               Pertinent Vitals/Pain Pain Assessment: Faces Faces Pain Scale: Hurts even more Pain Location: LUE Pain Descriptors / Indicators: Guarding;Aching;Moaning Pain Intervention(s): Limited activity within patient's tolerance;Monitored during session;Premedicated before session    Home Living Family/patient expects to be discharged to:: Private residence Living Arrangements: Non-relatives/Friends Available Help at Discharge: Friend(s);Available 24 hours/day Type of Home: House Home Access: Stairs to enter Entrance Stairs-Rails: Right Entrance Stairs-Number of Steps: 2 Home Layout: One level        Prior Function Level of Independence: Independent               Hand Dominance   Dominant Hand: Left    Extremity/Trunk Assessment   Upper Extremity Assessment Upper Extremity Assessment: LUE deficits/detail;Defer to OT evaluation LUE: Unable to fully assess due to immobilization    Lower Extremity Assessment Lower Extremity Assessment: Overall WFL for tasks assessed       Communication   Communication: No difficulties  Cognition Arousal/Alertness: Awake/alert Behavior During Therapy: WFL for tasks assessed/performed Overall Cognitive Status: Within Functional Limits for tasks assessed                                        General Comments      Exercises  Assessment/Plan    PT Assessment Patent does not need any further PT services  PT Problem List         PT Treatment Interventions      PT Goals (Current goals can be found in the Care Plan section)  Acute Rehab PT Goals Patient Stated Goal: Return home PT Goal Formulation: With patient Time For Goal Achievement: 04/12/17 Potential to Achieve Goals: Good    Frequency     Barriers to discharge        Co-evaluation                AM-PAC PT "6 Clicks" Daily Activity  Outcome Measure Difficulty turning over in bed (including adjusting bedclothes, sheets and blankets)?: None Difficulty moving from lying on back to sitting on the side of the bed? : A Little Difficulty sitting down on and standing up from a chair with arms (e.g., wheelchair, bedside commode, etc,.)?: A Little Help needed moving to and from a bed to chair (including a wheelchair)?: None Help needed walking in hospital room?: None Help needed climbing 3-5 steps with a railing? : None 6 Click Score: 22    End of Session Equipment Utilized During Treatment: Gait belt Activity Tolerance: Patient tolerated treatment well Patient left: in chair;with call bell/phone within reach Nurse Communication: Mobility status      Time: 1610-9604 PT Time Calculation (min) (ACUTE ONLY): 15 min   Charges:   PT Evaluation $PT Eval Low Complexity: 1 Low     PT G Codes:   PT G-Codes **NOT FOR INPATIENT CLASS** Functional Assessment Tool Used: AM-PAC 6 Clicks Basic Mobility Functional Limitation: Mobility: Walking and moving around Mobility: Walking and Moving Around Current Status ([id number]): At least 20 percent but less than 40 percent impaired, limited or restricted Mobility: Walking and Moving Around Goal Status [phone]): At least 20 percent but less than 40 percent impaired, limited or restricted Mobility: Walking and Moving Around Discharge Status [phone]): At least 20 percent but less than 40 percent impaired, limited or restricted   Ina Homes, PT, DPT [phone] Acute Rehab  Malachy Chamber 03/29/2017, 10:00 AM

## 2017-03-29 NOTE — Discharge Summary (Signed)
Physician Discharge Summary  Patient ID: Dan Clark MRN: [id number] DOB/AGE: [birth year] 56 y.o.  Admit date: 03/28/2017 Discharge date: 03/29/2017  Admission Diagnoses:  Closed fracture of left proximal humerus  Discharge Diagnoses:  Principal Problem:   Closed fracture of left proximal humerus   Past Medical History:  Diagnosis Date  . Arthritis   . Closed fracture of left proximal humerus 03/28/2017  . GERD (gastroesophageal reflux disease)     Surgeries: Procedure(s): OPEN REDUCTION INTERNAL FIXATION (ORIF) LEFT PROXIMAL HUMERUS FRACTURE on 03/28/2017   Consultants (if any):   Discharged Condition: Improved  Hospital Course: Dan Clark is an 56 y.o. male who was admitted 03/28/2017 with a diagnosis of Closed fracture of left proximal humerus and went to the operating room on 03/28/2017 and underwent the above named procedures.    He was given perioperative antibiotics:  Anti-infectives    Start     Dose/Rate Route Frequency Ordered Stop   03/28/17 2200  ceFAZolin (ANCEF) IVPB 1 g/50 mL premix     1 g 100 mL/hr over 30 Minutes Intravenous Every 6 hours 03/28/17 2027 03/29/17 1559   03/28/17 1137  ceFAZolin (ANCEF) IVPB 2g/100 mL premix     2 g 200 mL/hr over 30 Minutes Intravenous On call to O.R. 03/28/17 1137 03/28/17 1601    .  He was given sequential compression devices, early ambulation for DVT prophylaxis.  He benefited maximally from the hospital stay and there were no complications.    Recent vital signs:  Vitals:   03/28/17 2232 03/29/17 0539  BP: (!) 141/82 (!) 159/91  Pulse: 74 90  Resp: 18 18  Temp: 99 F (37.2 C) 99.7 F (37.6 C)  SpO2: 99% 100%    Recent laboratory studies:  Lab Results  Component Value Date   HGB 9.7 (L) 03/29/2017   HGB 11.6 (L) 03/28/2017   HGB 12.1 (L) 11/19/2010   Lab Results  Component Value Date   WBC 7.6 03/29/2017   PLT 239 03/29/2017   Lab Results  Component Value Date   INR 0.89 11/18/2010    Lab Results  Component Value Date   NA 134 (L) 03/29/2017   K 3.7 03/29/2017   CL 97 (L) 03/29/2017   CO2 29 03/29/2017   BUN <5 (L) 03/29/2017   CREATININE 0.98 03/29/2017   GLUCOSE 117 (H) 03/29/2017    Discharge Medications:   Allergies as of 03/29/2017      Reactions   No Known Allergies       Medication List    STOP taking these medications   HYDROcodone-acetaminophen 5-325 MG tablet Commonly known as:  NORCO/VICODIN     TAKE these medications   baclofen 10 MG tablet Commonly known as:  LIORESAL Take 1 tablet (10 mg total) by mouth 3 (three) times daily. As needed for muscle spasm   oxyCODONE 5 MG immediate release tablet Commonly known as:  ROXICODONE Take 1-2 tablets (5-10 mg total) by mouth every 4 (four) hours as needed for severe pain.   sennosides-docusate sodium 8.6-50 MG tablet Commonly known as:  SENOKOT-S Take 2 tablets by mouth daily.       Diagnostic Studies: Dg Chest 1 View  Result Date: 03/23/2017 CLINICAL DATA:  Larey Seat out of a truck yesterday, obvious swelling and deformity EXAM: CHEST 1 VIEW COMPARISON:  None FINDINGS: Normal heart size, mediastinal contours, and pulmonary vascularity. Lungs clear. No pleural effusion or pneumothorax. Displaced fracture at surgical neck LEFT humerus. No additional focal osseous findings.  IMPRESSION: Displaced fracture at surgical neck LEFT humerus. No acute intrathoracic abnormalities. Electronically Signed   By: Ulyses Southward M.D.   On: 03/23/2017 11:16   Dg Shoulder Left  Result Date: 03/28/2017 CLINICAL DATA:  56 year old male with history of left proximal humerus fracture status post ORIF. EXAM: DG C-ARM 61-120 MIN; LEFT SHOULDER - 2+ VIEW COMPARISON:  03/23/2017. FINDINGS: Two intraoperative fluoroscopic spot views of the left shoulder are submitted for evaluation demonstrating the placement of a lateral plate and screw fixation device traversing the previously noted surgical neck fracture of the left humerus.  Near anatomic alignment has been restored. No acute complicating features. IMPRESSION: 1. Intraoperative documentation of ORIF for left humeral neck fracture, as above. Electronically Signed   By: Trudie Reed M.D.   On: 03/28/2017 20:50   Dg Shoulder Left  Result Date: 03/23/2017 CLINICAL DATA:  Larey Seat out of a truck yesterday, obvious swelling and deformity EXAM: LEFT SHOULDER - 2+ VIEW COMPARISON:  None FINDINGS: Mild osseous demineralization. AC joint alignment normal. Displaced fracture at surgical neck LEFT humerus. No dislocation or bone destruction. Overlying soft tissue swelling laterally. Visualized LEFT ribs intact. IMPRESSION: Displaced fracture at surgical neck LEFT humerus. Electronically Signed   By: Ulyses Southward M.D.   On: 03/23/2017 11:16   Dg Shoulder Left Port  Result Date: 03/28/2017 CLINICAL DATA:  Status post ORIF left proximal humerus fracture. EXAM: LEFT SHOULDER - 1 VIEW COMPARISON:  Preoperative radiographs 03/23/2017 FINDINGS: Plate and multi screw fixation of comminuted proximal humerus fracture. Alignment is grossly unchanged from preoperative exam on this single portable view. IMPRESSION: Plate and screw fixation of comminuted proximal humerus fracture without immediate postoperative complication. Electronically Signed   By: Rubye Oaks M.D.   On: 03/28/2017 21:11   Dg Humerus Left  Result Date: 03/23/2017 CLINICAL DATA:  Larey Seat out of a truck yesterday, obvious swelling and deformity EXAM: LEFT HUMERUS - 2+ VIEW COMPARISON:  LEFT shoulder radiographs 03/23/2017 FINDINGS: Displaced fracture surgical neck LEFT humerus. Bones appear demineralized. Remainder of humerus appears intact. Elbow joint alignment normal. Prominent olecranon spur. No additional fracture or dislocation seen. IMPRESSION: Displaced fracture at surgical neck LEFT humerus. Electronically Signed   By: Ulyses Southward M.D.   On: 03/23/2017 11:17   Dg C-arm 61-120 Min  Result Date: 03/28/2017 CLINICAL  DATA:  56 year old male with history of left proximal humerus fracture status post ORIF. EXAM: DG C-ARM 61-120 MIN; LEFT SHOULDER - 2+ VIEW COMPARISON:  03/23/2017. FINDINGS: Two intraoperative fluoroscopic spot views of the left shoulder are submitted for evaluation demonstrating the placement of a lateral plate and screw fixation device traversing the previously noted surgical neck fracture of the left humerus. Near anatomic alignment has been restored. No acute complicating features. IMPRESSION: 1. Intraoperative documentation of ORIF for left humeral neck fracture, as above. Electronically Signed   By: Trudie Reed M.D.   On: 03/28/2017 20:50    Disposition: 01-Home or Self Care    Follow-up Information    Teryl Lucy, MD. Schedule an appointment as soon as possible for a visit in 2 weeks.   Specialty:  Orthopedic Surgery Contact information: [address] [place] [address] [phone]            Signed: Eulas Post 03/29/2017, 8:04 AM

## 2017-03-29 NOTE — Progress Notes (Signed)
OT Cancellation Note  Patient Details Name: Dan Clark MRN: [id number] DOB: 03-07-1961   Cancelled Treatment:    Reason Eval/Treat Not Completed: Other (comment); Pt currently with increased pain at rest (RN notified for pain meds), Pt politely requesting OT return after he receives pain medication; will follow up at a later time to complete OT eval.  Marcy Siren, OT Pager [phone] 03/29/2017   Orlando Penner 03/29/2017, 8:32 AM

## 2017-03-29 NOTE — Progress Notes (Signed)
Patient for discharge home accompanied by his family. Medications and discharge instructions explained to the patient. He verbalized understanding. Copies given to him including original prescription. IV saline lock removed.

## 2017-03-29 NOTE — Evaluation (Signed)
Occupational Therapy Evaluation Patient Details Name: Dan Clark MRN: [id number] DOB: 1960/08/27 Today's Date: 03/29/2017    History of Present Illness Pt is 56 y.o. male admitted with L humerus fx; now s/p L humeral ORIF on 03/28/17. Pertinent PMH includes arthritis, GERD.   Clinical Impression   This 56 y/o M presents with the above. At baseline Pt is independent with ADLs and functional mobility. Pt currently requires Mod-MaxA for UB ADLs due to adhering to shoulder precautions, completed functional mobility with supervision for safety during session. Education provided throughout on shoulder protocol and compensatory techniques for completing ADLs while adhering to precautions. Pt demonstrates/verbalizes good understanding of education; family member present during session for education as well. Pt reports he will have family assist after return home for ADLs PRN. Questions answered throughout. No further acute OT needs identified at this time. Will sign off.     Follow Up Recommendations  DC plan and follow up therapy as arranged by surgeon;Supervision/Assistance - 24 hour    Equipment Recommendations  None recommended by OT           Precautions / Restrictions Precautions Precautions: Shoulder Shoulder Interventions: Shoulder sling/immobilizer;At all times;Off for dressing/bathing/exercises Precaution Booklet Issued: Yes (comment) Precaution Comments: reviewed shoulder precautions/protocol  Required Braces or Orthoses: Sling Restrictions Weight Bearing Restrictions: Yes LUE Weight Bearing: Non weight bearing      Mobility Bed Mobility Overal bed mobility: Modified Independent Bed Mobility: Supine to Sit     Supine to sit: HOB elevated     General bed mobility comments: Pt completes bed mobility without use of LUE   Transfers Overall transfer level: Needs assistance Equipment used: None Transfers: Sit to/from Stand Sit to Stand: Modified independent  (Device/Increase time);Supervision         General transfer comment: Able to push off with RUE into standing; supervision for safety     Balance Overall balance assessment: Modified Independent         Standing balance support: No upper extremity supported;During functional activity Standing balance-Leahy Scale: Good Standing balance comment: standing to complete elbow ROM                            ADL either performed or assessed with clinical judgement   ADL Overall ADL's : Needs assistance/impaired Eating/Feeding: Set up;Sitting   Grooming: Min guard;Sitting   Upper Body Bathing: Minimal assistance;Sitting;Adhering to UE precautions   Lower Body Bathing: Minimal assistance;Sit to/from stand   Upper Body Dressing : Moderate assistance;Adhering to UE precautions;Sitting   Lower Body Dressing: Minimal assistance;Sit to/from stand   Toilet Transfer: Min guard;Ambulation;Regular Social worker and Hygiene: Min guard;Sit to/from stand       Functional mobility during ADLs: Min guard General ADL Comments: education provided on shoulder protocol and compensatory techniques for completing ADLs while adhering to shoulder precautions                          Pertinent Vitals/Pain Faces Pain Scale: Hurts even more Pain Location: LUE Pain Descriptors / Indicators: Guarding;Aching;Moaning Pain Intervention(s): Limited activity within patient's tolerance;Monitored during session;Ice applied;Premedicated before session     Hand Dominance Left   Extremity/Trunk Assessment Upper Extremity Assessment Upper Extremity Assessment: LUE deficits/detail LUE Deficits / Details: L humerus fx s/p ORIF LUE: Unable to fully assess due to immobilization   Lower Extremity Assessment Lower Extremity Assessment: Overall WFL for tasks assessed  Communication Communication Communication: No difficulties   Cognition  Arousal/Alertness: Awake/alert Behavior During Therapy: WFL for tasks assessed/performed Overall Cognitive Status: Within Functional Limits for tasks assessed                                           Exercises Shoulder Exercises Elbow Flexion: AROM;10 reps;Standing;Left Elbow Extension: AROM;Left;Standing;10 reps Wrist Flexion: AROM;Seated;10 reps;Left Wrist Extension: AROM;Left;Seated;10 reps Digit Composite Flexion: AROM;10 reps;Left;Seated Composite Extension: AROM;10 reps;Left;Seated Neck Flexion: AROM;Seated Neck Extension: AROM;Seated Neck Lateral Flexion - Right: AROM;Seated Neck Lateral Flexion - Left: AROM;Seated   Shoulder Instructions Shoulder Instructions Donning/doffing shirt without moving shoulder: Minimal assistance;Patient able to independently direct caregiver Method for sponge bathing under operated UE: Minimal assistance;Patient able to independently direct caregiver Donning/doffing sling/immobilizer: Maximal assistance;Patient able to independently direct caregiver Correct positioning of sling/immobilizer: Moderate assistance;Patient able to independently direct caregiver ROM for elbow, wrist and digits of operated UE: Min-guard Sling wearing schedule (on at all times/off for ADL's): Independent Proper positioning of operated UE when showering: Independent Positioning of UE while sleeping: Minimal assistance;Patient able to independently direct caregiver    Home Living Family/patient expects to be discharged to:: Private residence Living Arrangements: Other relatives Available Help at Discharge: Friend(s);Available 24 hours/day Type of Home: House Home Access: Stairs to enter [employer] of Steps: 2 Entrance Stairs-Rails: Right Home Layout: One level     Bathroom Shower/Tub: Chief Strategy Officer: Standard         Additional Comments: Pt reports he will be able to borrow a shower seat       Prior  Functioning/Environment Level of Independence: Independent                 OT Problem List: Decreased range of motion;Decreased knowledge of use of DME or AE;Decreased knowledge of precautions;Pain;Impaired UE functional use            OT Goals(Current goals can be found in the care plan section) Acute Rehab OT Goals Patient Stated Goal: Return home OT Goal Formulation: With patient                                 AM-PAC PT "6 Clicks" Daily Activity     Outcome Measure Help from another person eating meals?: None Help from another person taking care of personal grooming?: A Little Help from another person toileting, which includes using toliet, bedpan, or urinal?: A Little Help from another person bathing (including washing, rinsing, drying)?: A Little Help from another person to put on and taking off regular upper body clothing?: A Lot Help from another person to put on and taking off regular lower body clothing?: A Little 6 Click Score: 18   End of Session Equipment Utilized During Treatment: Other (comment) (L shoulder sling ) Nurse Communication: Mobility status  Activity Tolerance: Patient tolerated treatment well Patient left: in bed;with call bell/phone within reach;with bed alarm set;with family/visitor present  OT Visit Diagnosis: Muscle weakness (generalized) (M62.81);Pain;Other (comment) (L humerus fx ) Pain - Right/Left: Left Pain - part of body: Arm;Shoulder                Time: 5498-2641 OT Time Calculation (min): 27 min Charges:  OT General Charges $OT Visit: 1 Procedure OT Evaluation $OT Eval Low Complexity: 1 Procedure OT Treatments $Self Care/Home  Management : 8-22 mins G-Codes: OT G-codes **NOT FOR INPATIENT CLASS** Functional Assessment Tool Used: AM-PAC 6 Clicks Daily Activity;Clinical judgement Functional Limitation: Self care Self Care Current Status (Z6109): At least 20 percent but less than 40 percent impaired, limited or  restricted Self Care Goal Status (U0454): At least 20 percent but less than 40 percent impaired, limited or restricted Self Care Discharge Status [phone]): At least 20 percent but less than 40 percent impaired, limited or restricted   Marcy Siren, OT Pager [phone] 03/29/2017   Orlando Penner 03/29/2017, 2:34 PM

## 2017-04-18 ENCOUNTER — Encounter (INDEPENDENT_AMBULATORY_CARE_PROVIDER_SITE_OTHER): Payer: Self-pay | Admitting: Physician Assistant

## 2017-04-18 ENCOUNTER — Ambulatory Visit (INDEPENDENT_AMBULATORY_CARE_PROVIDER_SITE_OTHER): Payer: Self-pay | Admitting: Physician Assistant

## 2017-04-18 VITALS — BP 127/75 | HR 61 | Temp 98.5°F | Resp 18 | Ht 71.0 in | Wt 142.0 lb

## 2017-04-18 DIAGNOSIS — Z09 Encounter for follow-up examination after completed treatment for conditions other than malignant neoplasm: Secondary | ICD-10-CM

## 2017-04-18 DIAGNOSIS — Z1159 Encounter for screening for other viral diseases: Secondary | ICD-10-CM

## 2017-04-18 DIAGNOSIS — Z9889 Other specified postprocedural states: Secondary | ICD-10-CM

## 2017-04-18 DIAGNOSIS — Z114 Encounter for screening for human immunodeficiency virus [HIV]: Secondary | ICD-10-CM

## 2017-04-18 DIAGNOSIS — R61 Generalized hyperhidrosis: Secondary | ICD-10-CM

## 2017-04-18 DIAGNOSIS — Z23 Encounter for immunization: Secondary | ICD-10-CM

## 2017-04-18 NOTE — Patient Instructions (Addendum)
Td Vaccine (Tetanus and Diphtheria): What You Need to Know 1. Why get vaccinated? Tetanus  and diphtheria are very serious diseases. They are rare in the [place] today, but people who do become infected often have severe complications. Td vaccine is used to protect adolescents and adults from both of these diseases. Both tetanus and diphtheria are infections caused by bacteria. Diphtheria spreads from person to person through coughing or sneezing. Tetanus-causing bacteria enter the body through cuts, scratches, or wounds. TETANUS (lockjaw) causes painful muscle tightening and stiffness, usually all over the body.  It can lead to tightening of muscles in the head and neck so you can't open your mouth, swallow, or sometimes even breathe. Tetanus kills about 1 out of every 10 people who are infected even after receiving the best medical care.  DIPHTHERIA can cause a thick coating to form in the back of the throat.  It can lead to breathing problems, paralysis, heart failure, and death.  Before vaccines, as many as 200,000 cases of diphtheria and hundreds of cases of tetanus were reported in the [place] each year. Since vaccination began, reports of cases for both diseases have dropped by about 99%. 2. Td vaccine Td vaccine can protect adolescents and adults from tetanus and diphtheria. Td is usually given as a booster dose every 10 years but it can also be given earlier after a severe and dirty wound or burn. Another vaccine, called Tdap, which protects against pertussis in addition to tetanus and diphtheria, is sometimes recommended instead of Td vaccine. Your doctor or the person giving you the vaccine can give you more information. Td may safely be given at the same time as other vaccines. 3. Some people should not get this vaccine  A person who has ever had a life-threatening allergic reaction after a previous dose of any tetanus or diphtheria containing vaccine, OR has a severe  allergy to any part of this vaccine, should not get Td vaccine. Tell the person giving the vaccine about any severe allergies.  Talk to your doctor if you: ? had severe pain or swelling after any vaccine containing diphtheria or tetanus, ? ever had a condition called Guillain Barre Syndrome (GBS), ? aren't feeling well on the day the shot is scheduled. 4. What are the risks from Td vaccine? With any medicine, including vaccines, there is a chance of side effects. These are usually mild and go away on their own. Serious reactions are also possible but are rare. Most people who get Td vaccine do not have any problems with it. Mild problems following Td vaccine: (Did not interfere with activities)  Pain where the shot was given (about 8 people in 10)  Redness or swelling where the shot was given (about 1 person in 4)  Mild fever (rare)  Headache (about 1 person in 4)  Tiredness (about 1 person in 4)  Moderate problems following Td vaccine: (Interfered with activities, but did not require medical attention)  Fever over 102F (rare)  Severe problems following Td vaccine: (Unable to perform usual activities; required medical attention)  Swelling, severe pain, bleeding and/or redness in the arm where the shot was given (rare).  Problems that could happen after any vaccine:  People sometimes faint after a medical procedure, including vaccination. Sitting or lying down for about 15 minutes can help prevent fainting, and injuries caused by a fall. Tell your doctor if you feel dizzy, or have vision changes or ringing in the ears.  Some people get   severe pain in the shoulder and have difficulty moving the arm where a shot was given. This happens very rarely.  Any medication can cause a severe allergic reaction. Such reactions from a vaccine are very rare, estimated at fewer than 1 in a million doses, and would happen within a few minutes to a few hours after the vaccination. As with any  medicine, there is a very remote chance of a vaccine causing a serious injury or death. The safety of vaccines is always being monitored. For more information, visit: www.cdc.gov/vaccinesafety/ 5. What if there is a serious reaction? What should I look for? Look for anything that concerns you, such as signs of a severe allergic reaction, very high fever, or unusual behavior. Signs of a severe allergic reaction can include hives, swelling of the face and throat, difficulty breathing, a fast heartbeat, dizziness, and weakness. These would usually start a few minutes to a few hours after the vaccination. What should I do?  If you think it is a severe allergic reaction or other emergency that can't wait, call 9-1-1 or get the person to the nearest hospital. Otherwise, call your doctor.  Afterward, the reaction should be reported to the Vaccine Adverse Event Reporting System (VAERS). Your doctor might file this report, or you can do it yourself through the VAERS web site at www.vaers.hhs.gov, or by calling 1-800-822-7967. ? VAERS does not give medical advice. 6. The National Vaccine Injury Compensation Program The National Vaccine Injury Compensation Program (VICP) is a federal program that was created to compensate people who may have been injured by certain vaccines. Persons who believe they may have been injured by a vaccine can learn about the program and about filing a claim by calling 1-800-338-2382 or visiting the VICP website at www.hrsa.gov/vaccinecompensation. There is a time limit to file a claim for compensation. 7. How can I learn more?  Ask your doctor. He or she can give you the vaccine package insert or suggest other sources of information.  Call your local or state health department.  Contact the Centers for Disease Control and Prevention (CDC): ? Call 1-800-232-4636 (1-800-CDC-INFO) ? Visit CDC's website at www.cdc.gov/vaccines CDC Td Vaccine VIS (11/22/15) This information is  not intended to replace advice given to you by your health care provider. Make sure you discuss any questions you have with your health care provider. Document Released: 05/27/2006 Document Revised: 04/19/2016 Document Reviewed: 04/19/2016 Elsevier Interactive Patient Education  2017 Elsevier Inc.  

## 2017-04-18 NOTE — Progress Notes (Signed)
Subjective:  Patient ID: Dan Clark, male    DOB: 10/31/1960  Age: 56 y.o. MRN: [id number]  CC: shoulder pain  HPI Dan Clark is a 56 y.o. male with a medical history of GERD and recent open reduction internal fixation of left proximal humerus (03/28/17) presents on f/u of ORIF. He was administered a total of 3 g of Ancef on the day of the procedure. Discharged on  Baclofen, Oxycodone, and Senokot-S. Went to orthopedics last Friday. Was told he is healing well and should return for another f/u at ortho. Pt reports feeling well except for night sweats since the surgery. Has lost a few pounds since surgery but his weight remains relatively stable. Says he tries to avoid going to the doctor whenever possible. Denies CP, palpitations, SOB, HA, f/c/n/v, abdominal pain, rash, or GI/GU sxs. Does not endorse any other symptoms.    Outpatient Medications Prior to Visit  Medication Sig Dispense Refill  . baclofen (LIORESAL) 10 MG tablet Take 1 tablet (10 mg total) by mouth 3 (three) times daily. As needed for muscle spasm 50 tablet 0  . oxyCODONE (ROXICODONE) 5 MG immediate release tablet Take 1-2 tablets (5-10 mg total) by mouth every 4 (four) hours as needed for severe pain. 50 tablet 0  . sennosides-docusate sodium (SENOKOT-S) 8.6-50 MG tablet Take 2 tablets by mouth daily. 30 tablet 1   No facility-administered medications prior to visit.      ROS Review of Systems  Constitutional: Negative for chills, fever and malaise/fatigue.  Eyes: Negative for blurred vision.  Respiratory: Negative for shortness of breath.   Cardiovascular: Negative for chest pain and palpitations.  Gastrointestinal: Negative for abdominal pain and nausea.  Genitourinary: Negative for dysuria and hematuria.  Musculoskeletal: Negative for joint pain and myalgias.  Skin: Negative for rash.  Neurological: Negative for tingling and headaches.  Psychiatric/Behavioral: Negative for depression. The patient is not  nervous/anxious.     Objective:  Resp 18   Ht 5\' 11"  (1.803 m)   Wt 142 lb (64.4 kg)   BMI 19.80 kg/m   BP/Weight 04/18/2017 03/29/2017 03/28/2017  Systolic BP - 159 -  Diastolic BP - 91 -  Wt. (Lbs) 142 - 138  BMI 19.8 - 19.25      Physical Exam  Constitutional: He is oriented to person, place, and time.  thin, NAD, polite, left arm in sling  HENT:  Head: Normocephalic and atraumatic.  Eyes: No scleral icterus.  Cardiovascular: Normal rate, regular rhythm and normal heart sounds.   Pulmonary/Chest: Effort normal and breath sounds normal.  Musculoskeletal: He exhibits no edema.  Neurological: He is alert and oriented to person, place, and time.  Skin: Skin is warm and dry. No rash noted. No erythema. No pallor.  Steri strips in place in the upper left arm, no suppuration, bleeding, cellulitis, induration.  Psychiatric: He has a normal mood and affect. His behavior is normal. Thought content normal.  Vitals reviewed.    Assessment & Plan:    1. History of open reduction and internal fixation (ORIF) procedure - CBC with Differential - Comprehensive metabolic panel  2. Hospital discharge follow-up - CBC with Differential - Comprehensive metabolic panel  3. Night sweats - TSH - HIV antibody - Hepatitis c antibody (reflex)  4. Encounter for screening for HIV - HIV antibody  5. Need for hepatitis C screening test - Hepatitis c antibody (reflex)  6. Need for Tdap vaccination - Tdap vaccine greater than or equal to 7yo  IM     Follow-up: Return in about 6 weeks (around 05/30/2017).   Loletta Specter PA

## 2017-04-19 LAB — COMPREHENSIVE METABOLIC PANEL
A/G RATIO: 1.4 (ref 1.2–2.2)
ALT: 18 IU/L (ref 0–44)
AST: 22 IU/L (ref 0–40)
Albumin: 4.2 g/dL (ref 3.5–5.5)
Alkaline Phosphatase: 111 IU/L (ref 39–117)
BUN/Creatinine Ratio: 8 — ABNORMAL LOW (ref 9–20)
BUN: 5 mg/dL — AB (ref 6–24)
Bilirubin Total: 0.3 mg/dL (ref 0.0–1.2)
CALCIUM: 9.5 mg/dL (ref 8.7–10.2)
CO2: 27 mmol/L (ref 20–29)
CREATININE: 0.66 mg/dL — AB (ref 0.76–1.27)
Chloride: 100 mmol/L (ref 96–106)
GFR, EST AFRICAN AMERICAN: 126 mL/min/{1.73_m2} (ref >59)
GFR, EST NON AFRICAN AMERICAN: 109 mL/min/{1.73_m2} (ref >59)
GLUCOSE: 78 mg/dL (ref 65–99)
Globulin, Total: 3 g/dL (ref 1.5–4.5)
Potassium: 4.4 mmol/L (ref 3.5–5.2)
Sodium: 140 mmol/L (ref 134–144)
TOTAL PROTEIN: 7.2 g/dL (ref 6.0–8.5)

## 2017-04-19 LAB — CBC WITH DIFFERENTIAL/PLATELET
Basophils Absolute: 0 10*3/uL (ref 0.0–0.2)
Basos: 0 %
EOS (ABSOLUTE): 0.1 10*3/uL (ref 0.0–0.4)
EOS: 2 %
Hematocrit: 37.8 % (ref 37.5–51.0)
Hemoglobin: 12.7 g/dL — ABNORMAL LOW (ref 13.0–17.7)
IMMATURE GRANS (ABS): 0 10*3/uL (ref 0.0–0.1)
IMMATURE GRANULOCYTES: 0 %
Lymphocytes Absolute: 2.1 10*3/uL (ref 0.7–3.1)
Lymphs: 39 %
MCH: 31.4 pg (ref 26.6–33.0)
MCHC: 33.6 g/dL (ref 31.5–35.7)
MCV: 94 fL (ref 79–97)
MONOCYTES: 12 %
Monocytes Absolute: 0.6 10*3/uL (ref 0.1–0.9)
Neutrophils Absolute: 2.6 10*3/uL (ref 1.4–7.0)
Neutrophils: 47 %
PLATELETS: 345 10*3/uL (ref 150–379)
RBC: 4.04 x10E6/uL — AB (ref 4.14–5.80)
RDW: 14.1 % (ref 12.3–15.4)
WBC: 5.5 10*3/uL (ref 3.4–10.8)

## 2017-04-19 LAB — HCV COMMENT:

## 2017-04-19 LAB — HIV ANTIBODY (ROUTINE TESTING W REFLEX): HIV SCREEN 4TH GENERATION: NONREACTIVE

## 2017-04-19 LAB — TSH: TSH: 0.949 u[IU]/mL (ref 0.450–4.500)

## 2017-04-19 LAB — HEPATITIS C ANTIBODY (REFLEX)

## 2017-04-23 ENCOUNTER — Telehealth (INDEPENDENT_AMBULATORY_CARE_PROVIDER_SITE_OTHER): Payer: Self-pay | Admitting: *Deleted

## 2017-04-23 NOTE — Telephone Encounter (Signed)
-----   Message from Loletta Specteroger David Gomez, PA-C sent at 04/22/2017 12:40 PM EDT ----- Slight anemia but better than previous. Signs of malnutrition, supplement diet with Boost, Ensure, etc protein shakes. HIV and Hep C negative. Keep f/u appointment on 05/30/17.

## 2017-04-23 NOTE — Telephone Encounter (Signed)
Patient verified DOB Patient is aware of anemia being better than previous result. Patient is aware of malnutrition being noted and advised to implement protein supplement shakes. Patient is also aware of Hep C and HIV being negative. Patient advised to keep 05/30/17 appointment with PCP. No further questions at this time.

## 2017-05-30 ENCOUNTER — Encounter (INDEPENDENT_AMBULATORY_CARE_PROVIDER_SITE_OTHER): Payer: Self-pay | Admitting: Physician Assistant

## 2017-05-30 ENCOUNTER — Ambulatory Visit (INDEPENDENT_AMBULATORY_CARE_PROVIDER_SITE_OTHER): Payer: Self-pay | Admitting: Physician Assistant

## 2017-05-30 VITALS — BP 114/69 | HR 86 | Temp 98.3°F | Wt 137.4 lb

## 2017-05-30 DIAGNOSIS — Z125 Encounter for screening for malignant neoplasm of prostate: Secondary | ICD-10-CM

## 2017-05-30 DIAGNOSIS — R61 Generalized hyperhidrosis: Secondary | ICD-10-CM

## 2017-05-30 DIAGNOSIS — Z122 Encounter for screening for malignant neoplasm of respiratory organs: Secondary | ICD-10-CM

## 2017-05-30 DIAGNOSIS — Z1211 Encounter for screening for malignant neoplasm of colon: Secondary | ICD-10-CM

## 2017-05-30 DIAGNOSIS — R351 Nocturia: Secondary | ICD-10-CM

## 2017-05-30 DIAGNOSIS — R6881 Early satiety: Secondary | ICD-10-CM

## 2017-05-30 DIAGNOSIS — R3911 Hesitancy of micturition: Secondary | ICD-10-CM

## 2017-05-30 NOTE — Progress Notes (Signed)
   Subjective:  Patient ID: Dan Clark, male    DOB: 03/12/1961  Age: 56 y.o. MRN: [id number]  CC: f/u   HPI  Dan Clark is a 56 y.o. male with a medical history of GERD and tobacco abuse. Complains of occasional night sweats, urinary hesitancy, early satiety, and nocturia. Been smoking for "30 something years". Used to smoke 9-10 cigarettes a day but now smokes 1.5 cigars per day. No incontinence, hematuria, f/c/n/v, rash, CP, palpitations, SOB, cough, HA, or diarrhea/constipation.   Outpatient Medications Prior to Visit  Medication Sig Dispense Refill  . baclofen (LIORESAL) 10 MG tablet Take 1 tablet (10 mg total) by mouth 3 (three) times daily. As needed for muscle spasm 50 tablet 0  . oxyCODONE (ROXICODONE) 5 MG immediate release tablet Take 1-2 tablets (5-10 mg total) by mouth every 4 (four) hours as needed for severe pain. 50 tablet 0  . sennosides-docusate sodium (SENOKOT-S) 8.6-50 MG tablet Take 2 tablets by mouth daily. 30 tablet 1   No facility-administered medications prior to visit.      ROS Review of Systems  Constitutional: Negative for chills, fever and malaise/fatigue.  Eyes: Negative for blurred vision.  Respiratory: Negative for shortness of breath.   Cardiovascular: Negative for chest pain, palpitations and leg swelling.  Gastrointestinal: Negative for abdominal pain and nausea.  Genitourinary: Positive for frequency (nocturia). Negative for dysuria, flank pain, hematuria and urgency.  Musculoskeletal: Negative for back pain, joint pain and myalgias.  Skin: Negative for rash.  Neurological: Negative for tingling and headaches.  Psychiatric/Behavioral: Negative for depression. The patient is not nervous/anxious.       Physical Exam  Constitutional: He is oriented to person, place, and time.  thin, NAD, polite  HENT:  Head: Normocephalic and atraumatic.  Eyes: Conjunctivae are normal. No scleral icterus.  Neck: Normal range of motion. Neck supple.  No thyromegaly present.  Cardiovascular: Normal rate, regular rhythm and normal heart sounds.   Pulmonary/Chest: Effort normal and breath sounds normal. No respiratory distress.  Abdominal: Soft. Bowel sounds are normal. There is no tenderness.  Musculoskeletal: He exhibits no edema.  Neurological: He is alert and oriented to person, place, and time. No cranial nerve deficit. Coordination normal.  Skin: Skin is warm and dry. No rash noted. No erythema. No pallor.  Psychiatric: He has a normal mood and affect. His behavior is normal. Thought content normal.  Vitals reviewed.         Blood pressure 114/69, pulse 86, temperature 98.3 F (36.8 C), temperature source Oral, weight 137 lb 6.4 oz (62.3 kg), SpO2 97 %.    Assessment & Plan:    1. Nocturia - BP normal, A1c 5.3%, no sleep disorder, no mood disorder. Will check PSA in light of associated urinary hesitancy and occasional night sweats.   2. Urinary hesitancy - PSA  3. Early satiety -PSA - Fecal occult blood, imunochemical_   4. Night sweats - CT CHEST LUNG CA SCREEN LOW DOSE W/O CM; Future  5. Screening for prostate cancer - PSA  6. Screen for colon cancer - Fecal occult blood, imunochemical  7. Encounter for screening for lung cancer - CT CHEST LUNG CA SCREEN LOW DOSE W/O CM; Future   Follow-up: Return in about 6 months (around 11/28/2017).   Loletta Specteroger David Vincie Linn PA

## 2017-05-30 NOTE — Patient Instructions (Signed)
Community Resources  Advocacy/Legal Legal Aid [place]:  1-866-219-5262  /  336-272-0148  Family Justice Center:  336-641-7233  [facility] 24-hr Crisis line:  336-273-7273  Women's Resource Center, [place]:  336-275-6090  Court Watch (custody):  336-275-2346  [place] [facility]:   336-279-9299    Baby & Breastfeeding Car Seat Inspection @ Various [place] Fire Depts.- call 336-373-2177  [facility]  336-832-6860  [facility] 336-878-6712  WIC: 336-641-3663 ([place]);  336-641-7571 ([place])  La Leche League:  1-877-452-5321   Childcare [place] [facility]: 336-369-5097 ([place]) / 336-887-8224 ([place])  - Child Care Resources/ Referrals/ Scholarships  - Head Start/ Early Head Start (call or apply online)  [place] DHHS: [place] Pre-K :  1-800-859-0829 / 336-274-5437   Employment / Job Search [facility] [place]: 336-275-6090 / [address]  [place] [employer] ([employer]Link): 336-373-5922 ([place]) / 336-882-4141 ([place])  [facility] Community Resource/ Career Center: 336-275-9801 / 336-282-7307  [facility] Job & Career Center: 336-373-3764  DHHS Work First: 336-641-3447 ([place]) / 336-641-3447 ([place])  [facility] [place]:  336-676-5871   Financial Assistance [facility]:  336-553-2657  [facility]: 336-235-0368  [employer] (furniture):  336-370-4002  [place] [facility]: 336-373-4264  Low Income Energy Assistance  336-641-3000   Food Assistance DHHS- SNAP/ Food Stamps: 336-641-4588  WIC: [place]- 336-641-3663 ;  [place] 336-641-7571  Little Green Book- Free Meals  Little Blue Book- Free Food Pantries  During the summer, text "FOOD" to 877877   General Health / Clinics (Adults) Orange Card (for Adults) through [facility]: (336) 895-4900  [facility]:   336-832-8035  [facility]:   336-832-4444  Health Department:  336-641-3245  [facility]:  336-415-3877 / 336-641-2100  [facility] [place]:   336-373-0678  [facility]:   336-334-4822 x 50251   Housing [place] Housing Coalition:   336-691-9521  [place] Housing Authority:  336-275-8501  Affordable Housing Managemnt:  336-273-0568   Immigrant/ Refugee Center for [place]ians (UNCG):  336-256-1065  Faith [facility]:  336-379-0037  [employer] [facility]:  336-937-4701  Church World Services:  336-617-0381  African Services Coalition:  336-574-2677   LGBTQ YouthSAFE  www.youthsafe[place].org  PFLAG  336-541-6754 / info@[place].org  The Trevor Project:  1-866-488-7386   Mental Health/ Substance Use [facility]  336-387-6161  [facility] [place]:  336-832-9700 or 1-800-711-2635  [facility] of Care:  336-271-5888  [facility]:  336-294-1349  [facility]:  336-542-2884  [facility] (walk-ins)  336-676-6840 / [address]  [place]:  800-449-1287  Alcoholics Anonymous:  336-854-4278  Narcotics Anonymous:  800-365-1036  Quit Smoking Hotline:  800-QUIT-NOW (800-784-8669)   Parenting Children's Home Society:  800-632-1400  [place]: Education Center & Support Groups:  336-832-6682  YWCA: 336-273-3461  [employer]CG: Bringing Out the Best:  336-334-3120               Thriving at Three (Hispanic families): 336-256-1066  Healthy Start ([facility]):  336-387-6161 x2288  Parents as Teachers:  336-691-0024  [place] [facility]- Learning Together (Immigrants): 336-369-5001   Poison Control 800-222-1222  Sports & Recreation [employer] Open Doors Application: ymcanwnc.org/join/open-doors-financial-assistance/  City of [place] Recreation Centers: http://www.[place].gov/index.aspx?page=3615   Special Needs Family Support Network:  336-832-6507  Autism Society of [place]:   336-333-0197 x1402 or x1412 /  800-785-1035  [facility]CH [place]:  336-334-5773     ARC of [place]:  336-373-1076  Children's Developmental Service Agency (CDSA):  336-334-5601  CC4C (Care Coordination for Children):  336-641-7641   Transportation Medicaid Transportation: 336-641-4848 to apply  [place] Transit Authority: 336-335-6499 (reduced-fare bus ID to Medicaid/ Medicare/ Orange Card)  SCAT Paratransit services: Eligible riders only, call 336-333-6589 for application   Tutoring/Mentoring [facility] Child Development Institute: 336-230-2138  Big Brothers/ Big Sisters: 336-378-9100 ([place])  336-882-4167 ([place])  ACES through child's school: 336-370-2321  [employer] Achievers: contact your local Y  SHIELD Mentor Program: 336-337-2771   

## 2017-05-31 ENCOUNTER — Telehealth: Payer: Self-pay

## 2017-05-31 LAB — PSA: Prostate Specific Ag, Serum: 0.6 ng/mL (ref 0.0–4.0)

## 2017-05-31 NOTE — Telephone Encounter (Signed)
-----   Message from Loletta Specteroger David Gomez, PA-C sent at 05/31/2017 10:25 AM EDT ----- PSA result does not support him having prostate cancer.  Awaiting CT and FIT testing.

## 2017-05-31 NOTE — Telephone Encounter (Signed)
Patient aware of PSA results and will bring FIT in on Wednesday. Maryjean Mornempestt S Roberts, CMA

## 2017-06-05 ENCOUNTER — Ambulatory Visit (HOSPITAL_COMMUNITY)
Admission: RE | Admit: 2017-06-05 | Discharge: 2017-06-05 | Disposition: A | Discharge status: Home / Self Care | Payer: Self-pay | Source: Ambulatory Visit | Attending: Physician Assistant | Admitting: Physician Assistant

## 2017-06-05 DIAGNOSIS — R61 Generalized hyperhidrosis: Secondary | ICD-10-CM | POA: Insufficient documentation

## 2017-06-05 DIAGNOSIS — J432 Centrilobular emphysema: Secondary | ICD-10-CM | POA: Insufficient documentation

## 2017-06-05 DIAGNOSIS — R918 Other nonspecific abnormal finding of lung field: Secondary | ICD-10-CM | POA: Insufficient documentation

## 2017-06-05 DIAGNOSIS — Z122 Encounter for screening for malignant neoplasm of respiratory organs: Secondary | ICD-10-CM | POA: Insufficient documentation

## 2017-06-05 DIAGNOSIS — J438 Other emphysema: Secondary | ICD-10-CM | POA: Insufficient documentation

## 2017-06-05 MED ORDER — IOPAMIDOL (ISOVUE-300) INJECTION 61%
INTRAVENOUS | Status: AC
  Administered 2017-06-05: 75 mL
  Filled 2017-06-05: qty 75

## 2017-06-06 ENCOUNTER — Telehealth (INDEPENDENT_AMBULATORY_CARE_PROVIDER_SITE_OTHER): Payer: Self-pay

## 2017-06-06 NOTE — Telephone Encounter (Signed)
Patient aware of CT suggesting COPD and can have FU CT in 1 year to monitor calcifications that are benign at this point. Dan Clark, CMA

## 2017-06-06 NOTE — Telephone Encounter (Signed)
-----   Message from Loletta Specteroger David Gomez, PA-C sent at 06/06/2017  9:07 AM EDT ----- Imaging suggests COPD. Can have a noncontrast CT in one year to f/u on small calcifications in the lungs. These calcifications are favored to be benign.

## 2017-06-10 LAB — FECAL OCCULT BLOOD, IMMUNOCHEMICAL: Fecal Occult Bld: NEGATIVE

## 2017-06-11 ENCOUNTER — Telehealth (INDEPENDENT_AMBULATORY_CARE_PROVIDER_SITE_OTHER): Payer: Self-pay

## 2017-06-11 NOTE — Telephone Encounter (Signed)
Patient aware of negative fecal occult blood. Dan Clark Dan Clark, CMA  

## 2017-06-11 NOTE — Telephone Encounter (Signed)
-----   Message from Loletta Specteroger David Gomez, PA-C sent at 06/10/2017  5:09 PM EDT ----- Negative for fecal occult blood.

## 2017-10-28 ENCOUNTER — Encounter (INDEPENDENT_AMBULATORY_CARE_PROVIDER_SITE_OTHER): Payer: Self-pay | Admitting: Physician Assistant

## 2017-10-28 ENCOUNTER — Ambulatory Visit (INDEPENDENT_AMBULATORY_CARE_PROVIDER_SITE_OTHER): Payer: Self-pay | Admitting: Physician Assistant

## 2017-10-28 VITALS — BP 130/87 | HR 79 | Temp 98.1°F | Resp 18 | Ht 71.5 in | Wt 134.0 lb

## 2017-10-28 DIAGNOSIS — G8929 Other chronic pain: Secondary | ICD-10-CM

## 2017-10-28 DIAGNOSIS — Z79899 Other long term (current) drug therapy: Secondary | ICD-10-CM

## 2017-10-28 DIAGNOSIS — M25512 Pain in left shoulder: Secondary | ICD-10-CM

## 2017-10-28 MED ORDER — HYDROCODONE BITARTRATE 10 MG PO C12A: 0.5000 | EXTENDED_RELEASE_CAPSULE | Freq: Two times a day (BID) | ORAL | 0 refills | Status: DC

## 2017-10-28 MED ORDER — HYDROCODONE-ACETAMINOPHEN 5-325 MG PO TABS: 1.0000 | ORAL_TABLET | Freq: Two times a day (BID) | ORAL | 0 refills | Status: DC

## 2017-10-28 NOTE — Patient Instructions (Signed)
Shoulder Pain Many things can cause shoulder pain, including:  An injury.  Moving the arm in the same way again and again (overuse).  Joint pain (arthritis).  Follow these instructions at home: Take these actions to help with your pain:  Squeeze a soft ball or a foam pad as much as you can. This helps to prevent swelling. It also makes the arm stronger.  Take over-the-counter and prescription medicines only as told by your doctor.  If told, put ice on the area: ? Put ice in a plastic bag. ? Place a towel between your skin and the bag. ? Leave the ice on for 20 minutes, 2-3 times per day. Stop putting on ice if it does not help with the pain.  If you were given a shoulder sling or immobilizer: ? Wear it as told. ? Remove it to shower or bathe. ? Move your arm as little as possible. ? Keep your hand moving. This helps prevent swelling.  Contact a doctor if:  Your pain gets worse.  Medicine does not help your pain.  You have new pain in your arm, hand, or fingers. Get help right away if:  Your arm, hand, or fingers: ? Tingle. ? Are numb. ? Are swollen. ? Are painful. ? Turn white or blue. This information is not intended to replace advice given to you by your health care provider. Make sure you discuss any questions you have with your health care provider. Document Released: 01/16/2008 Document Revised: 03/25/2016 Document Reviewed: 11/22/2014 Elsevier Interactive Patient Education  2018 Elsevier Inc.  
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
## 2017-10-31 ENCOUNTER — Other Ambulatory Visit (INDEPENDENT_AMBULATORY_CARE_PROVIDER_SITE_OTHER): Payer: Self-pay | Admitting: Physician Assistant

## 2017-10-31 DIAGNOSIS — G8929 Other chronic pain: Secondary | ICD-10-CM

## 2017-10-31 DIAGNOSIS — M25512 Pain in left shoulder: Principal | ICD-10-CM

## 2017-10-31 MED ORDER — HYDROCODONE BITARTRATE ER 10 MG PO CP12: 1.0000 | ORAL_CAPSULE | Freq: Every day | ORAL | 0 refills | Status: AC

## 2017-11-22 ENCOUNTER — Ambulatory Visit: Payer: Self-pay | Attending: Physician Assistant

## 2017-11-28 ENCOUNTER — Ambulatory Visit (INDEPENDENT_AMBULATORY_CARE_PROVIDER_SITE_OTHER): Payer: Self-pay | Admitting: Physician Assistant

## 2017-12-02 ENCOUNTER — Ambulatory Visit (INDEPENDENT_AMBULATORY_CARE_PROVIDER_SITE_OTHER): Payer: Self-pay | Admitting: Physician Assistant

## 2018-01-13 ENCOUNTER — Encounter (INDEPENDENT_AMBULATORY_CARE_PROVIDER_SITE_OTHER): Payer: Self-pay | Admitting: Physician Assistant

## 2018-01-13 ENCOUNTER — Ambulatory Visit (INDEPENDENT_AMBULATORY_CARE_PROVIDER_SITE_OTHER): Payer: Self-pay | Admitting: Physician Assistant

## 2018-01-13 ENCOUNTER — Other Ambulatory Visit: Payer: Self-pay

## 2018-01-13 VITALS — BP 157/87 | HR 66 | Temp 97.8°F | Ht 71.5 in | Wt 133.8 lb

## 2018-01-13 DIAGNOSIS — G8929 Other chronic pain: Secondary | ICD-10-CM

## 2018-01-13 DIAGNOSIS — Z1211 Encounter for screening for malignant neoplasm of colon: Secondary | ICD-10-CM

## 2018-01-13 DIAGNOSIS — M25512 Pain in left shoulder: Secondary | ICD-10-CM

## 2018-01-13 MED ORDER — TRAMADOL HCL 50 MG PO TABS: 50.0000 mg | ORAL_TABLET | Freq: Three times a day (TID) | ORAL | 0 refills | Status: DC | PRN

## 2018-01-13 MED ORDER — NAPROXEN 500 MG PO TABS: 500.0000 mg | ORAL_TABLET | Freq: Two times a day (BID) | ORAL | 0 refills | Status: DC

## 2018-01-13 MED FILL — traMADol HCL 50 MG TABS: 50 | 10 days supply | Qty: 30 | Fill #0

## 2018-01-13 MED FILL — NAPROXEN 500 MG TABLET: 500 | 15 days supply | Qty: 30 | Fill #0
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
## 2018-01-13 NOTE — Patient Instructions (Signed)
Shoulder Pain Many things can cause shoulder pain, including:  An injury.  Moving the arm in the same way again and again (overuse).  Joint pain (arthritis).  Follow these instructions at home: Take these actions to help with your pain:  Squeeze a soft ball or a foam pad as much as you can. This helps to prevent swelling. It also makes the arm stronger.  Take over-the-counter and prescription medicines only as told by your doctor.  If told, put ice on the area: ? Put ice in a plastic bag. ? Place a towel between your skin and the bag. ? Leave the ice on for 20 minutes, 2-3 times per day. Stop putting on ice if it does not help with the pain.  If you were given a shoulder sling or immobilizer: ? Wear it as told. ? Remove it to shower or bathe. ? Move your arm as little as possible. ? Keep your hand moving. This helps prevent swelling.  Contact a doctor if:  Your pain gets worse.  Medicine does not help your pain.  You have new pain in your arm, hand, or fingers. Get help right away if:  Your arm, hand, or fingers: ? Tingle. ? Are numb. ? Are swollen. ? Are painful. ? Turn white or blue. This information is not intended to replace advice given to you by your health care provider. Make sure you discuss any questions you have with your health care provider. Document Released: 01/16/2008 Document Revised: 03/25/2016 Document Reviewed: 11/22/2014 Elsevier Interactive Patient Education  2018 Elsevier Inc.  

## 2018-01-17 ENCOUNTER — Ambulatory Visit (INDEPENDENT_AMBULATORY_CARE_PROVIDER_SITE_OTHER): Payer: Self-pay

## 2018-01-17 ENCOUNTER — Encounter (INDEPENDENT_AMBULATORY_CARE_PROVIDER_SITE_OTHER): Payer: Self-pay | Admitting: Orthopaedic Surgery

## 2018-01-17 ENCOUNTER — Ambulatory Visit (INDEPENDENT_AMBULATORY_CARE_PROVIDER_SITE_OTHER): Payer: Self-pay | Admitting: Orthopaedic Surgery

## 2018-01-17 DIAGNOSIS — M25512 Pain in left shoulder: Secondary | ICD-10-CM

## 2018-01-17 DIAGNOSIS — G8929 Other chronic pain: Secondary | ICD-10-CM

## 2018-01-17 NOTE — Progress Notes (Signed)
Office Visit Note   Patient: Dan Clark           Date of Birth: 1960/09/26           MRN: [id number] Visit Date: 01/17/2018              Requested by: Loletta Specter, PA-C [address] [place], [place] [address] PCP: Loletta Specter, PA-C   Assessment & Plan: Visit Diagnoses:  1. Chronic left shoulder pain     Plan: At this point we will obtain an arthritis panel which will include uric acid.  We will also provide the patient with samples of pen said.  He will follow-up with [place] in 1 week's time to discuss lab results.  Follow-Up Instructions: Return in about 1 week (around 01/24/2018).   Orders:  Orders Placed This Encounter  Procedures  . XR Shoulder Left  . XR Hand Complete Left   No orders of the defined types were placed in this encounter.     Procedures: No procedures performed   Clinical Data: No additional findings.   Subjective: Chief Complaint  Patient presents with  . Left Shoulder - Pain    HPI patient is a pleasant 57 year old gentleman who presents our clinic today with continued left shoulder pain.  He is status post ORIF left proximal humerus fracture by Dr. Dion Saucier back in August 2018.  He notes continued pain to the entire left upper extremity since surgery.  He describes this as a constant ache.  He also notes weakness to the left hand.  He states that his left hand continuously wants to draw up but he is unable to make a full fist.  No numbness, tingling or burning.  He has been taking tramadol for the pain.  Of note, he states that he never went to formal physical therapy following surgery on the left shoulder.  Review of Systems as detailed in HPI.  All others reviewed and are negative.   Objective: Vital Signs: There were no vitals taken for this visit.  Physical Exam well-developed well-nourished gentleman no acute distress.  Alert and oriented x3.  Ortho Exam examination of his left shoulder reveals about 70% range of  motion.  Minimal tenderness over the proximal humerus.  Minimal swelling to the left hand.  He is unable to fully extend all 5 fingers and he is unable to make a full fist.  Minimally decreased grip strength.  Full sensation distally.  Specialty Comments:  No specialty comments available.  Imaging: Xr Hand Complete Left  Result Date: 01/17/2018 Left hand shows marked degenerative changes DIP and PIP joints as well as first CMC joint.  Xr Shoulder Left  Result Date: 01/17/2018 X-rays of the left shoulder show a healed fracture.    PMFS History: Patient Active Problem List   Diagnosis Date Noted  . Chronic left shoulder pain 01/17/2018  . Closed fracture of left proximal humerus 03/28/2017   Past Medical History:  Diagnosis Date  . Arthritis   . Closed fracture of left proximal humerus 03/28/2017  . GERD (gastroesophageal reflux disease)     History reviewed. No pertinent family history.  Past Surgical History:  Procedure Laterality Date  . MULTIPLE TOOTH EXTRACTIONS    . ORIF HUMERUS FRACTURE Left 03/28/2017  . ORIF HUMERUS FRACTURE Left 03/28/2017   Procedure: OPEN REDUCTION INTERNAL FIXATION (ORIF) LEFT PROXIMAL HUMERUS FRACTURE;  Surgeon: Teryl Lucy, MD;  Location: [facility];  Service: Orthopedics;  Laterality: Left;   Social  History   Occupational History  . Not on file  Tobacco Use  . Smoking status: Current Every Day Smoker    Years: 20.00    Types: Cigars  . Smokeless tobacco: Never Used  Substance and Sexual Activity  . Alcohol use: Yes    Alcohol/week: 8.4 oz    Types: 14 Cans of beer per week  . Drug use: Yes    Comment: past cocaine & marijuana use  . Sexual activity: Yes

## 2018-01-21 DIAGNOSIS — M25512 Pain in left shoulder: Secondary | ICD-10-CM

## 2018-01-21 LAB — ANA: Anti Nuclear Antibody(ANA): NEGATIVE

## 2018-01-21 LAB — SEDIMENTATION RATE: SED RATE: 2 mm/h (ref 0–20)

## 2018-01-21 LAB — URIC ACID: URIC ACID, SERUM: 7.1 mg/dL (ref 4.0–8.0)

## 2018-01-21 LAB — RHEUMATOID FACTOR: Rhuematoid fact SerPl-aCnc: <14 IU/mL (ref <14)

## 2018-01-24 ENCOUNTER — Ambulatory Visit (INDEPENDENT_AMBULATORY_CARE_PROVIDER_SITE_OTHER): Payer: Self-pay | Admitting: Orthopaedic Surgery

## 2018-01-24 ENCOUNTER — Encounter (INDEPENDENT_AMBULATORY_CARE_PROVIDER_SITE_OTHER): Payer: Self-pay | Admitting: Orthopaedic Surgery

## 2018-01-24 DIAGNOSIS — M79602 Pain in left arm: Secondary | ICD-10-CM

## 2018-01-24 DIAGNOSIS — G8929 Other chronic pain: Secondary | ICD-10-CM

## 2018-01-24 DIAGNOSIS — M25512 Pain in left shoulder: Secondary | ICD-10-CM

## 2018-01-24 MED ORDER — PREDNISONE 10 MG (21) PO TBPK: ORAL_TABLET | ORAL | 0 refills | Status: DC

## 2018-01-24 NOTE — Progress Notes (Signed)
      Patient: Dan Clark           Date of Birth: 06/30/1961           MRN: [id number] Visit Date: 01/24/2018 PCP: Loletta SpecterGomez, Roger David, PA-C   Assessment & Plan:  Chief Complaint:  Chief Complaint  Patient presents with  . Left Shoulder - Pain, Follow-up   Visit Diagnoses:  1. Chronic left shoulder pain   2. Left arm pain     Plan: Patient comes in today to discuss recent arthritis panel lab results.  He is status post ORIF left proximal humerus fracture by Dr. Dion SaucierLandau in August 2018.  He never attended physical therapy.  He has started pain and swelling to the left hand with the inability to make a full fist.  Arthritis panel from approximately 7 days ago was negative.  At this point, we will initiate formal physical therapy.  A prescription was given to the patient for this.  In the meantime we will also start him on a Sterapred taper.  He will follow-up with us in 6 weeks for recheck.  Call with concerns or questions in the meantime.  Follow-Up Instructions: Return in about 6 weeks (around 03/07/2018).   Orders:  Orders Placed This Encounter  Procedures  . Ambulatory referral to Physical Therapy   Meds ordered this encounter  Medications  . predniSONE (STERAPRED UNI-PAK 21 TAB) 10 MG (21) TBPK tablet    Sig: Take as directed    Dispense:  21 tablet    Refill:  0    Imaging: No results found.  PMFS History: Patient Active Problem List   Diagnosis Date Noted  . Chronic left shoulder pain 01/17/2018  . Closed fracture of left proximal humerus 03/28/2017   Past Medical History:  Diagnosis Date  . Arthritis   . Closed fracture of left proximal humerus 03/28/2017  . GERD (gastroesophageal reflux disease)     History reviewed. No pertinent family history.  Past Surgical History:  Procedure Laterality Date  . MULTIPLE TOOTH EXTRACTIONS    . ORIF HUMERUS FRACTURE Left 03/28/2017  . ORIF HUMERUS FRACTURE Left 03/28/2017   Procedure: OPEN REDUCTION INTERNAL  FIXATION (ORIF) LEFT PROXIMAL HUMERUS FRACTURE;  Surgeon: Teryl LucyLandau, Joshua, MD;  Location: [facility] OR;  Service: Orthopedics;  Laterality: Left;   Social History   Occupational History  . Not on file  Tobacco Use  . Smoking status: Current Every Day Smoker    Years: 20.00    Types: Cigars  . Smokeless tobacco: Never Used  Substance and Sexual Activity  . Alcohol use: Yes    Alcohol/week: 8.4 oz    Types: 14 Cans of beer per week  . Drug use: Yes    Comment: past cocaine & marijuana use  . Sexual activity: Yes

## 2018-02-05 ENCOUNTER — Other Ambulatory Visit: Payer: Self-pay

## 2018-02-05 ENCOUNTER — Encounter: Payer: Self-pay | Admitting: Physical Therapy

## 2018-02-05 ENCOUNTER — Ambulatory Visit: Payer: Self-pay | Attending: Orthopaedic Surgery | Admitting: Physical Therapy

## 2018-02-05 DIAGNOSIS — M25512 Pain in left shoulder: Secondary | ICD-10-CM | POA: Insufficient documentation

## 2018-02-05 DIAGNOSIS — M25612 Stiffness of left shoulder, not elsewhere classified: Secondary | ICD-10-CM | POA: Insufficient documentation

## 2018-02-05 DIAGNOSIS — M6281 Muscle weakness (generalized): Secondary | ICD-10-CM | POA: Insufficient documentation

## 2018-02-05 DIAGNOSIS — G8929 Other chronic pain: Secondary | ICD-10-CM | POA: Insufficient documentation

## 2018-02-05 NOTE — Therapy (Signed)
[facility] [address] [place], [place], [address] Phone: [phone]   Fax:  [phone]  Physical Therapy Evaluation  Patient Details  Name: Tomislav Micale Vegh MRN: [id number] Date of Birth: [birth year] Referring Provider: Dr Gershon Mussel    Encounter Date: 02/05/2018  PT End of Session - 02/05/18 1319    Visit Number  1    Number of Visits  16    Date for PT Re-Evaluation  04/02/18    Authorization Type  Notes say CAFA; cvg says self pay note sent to front desk to clarify     PT Start Time  0800    PT Stop Time  0843    PT Time Calculation (min)  43 min    Activity Tolerance  Patient tolerated treatment well    Behavior During Therapy  [facility] for tasks assessed/performed       Past Medical History:  Diagnosis Date  . Arthritis   . Closed fracture of left proximal humerus 03/28/2017  . GERD (gastroesophageal reflux disease)     Past Surgical History:  Procedure Laterality Date  . MULTIPLE TOOTH EXTRACTIONS    . ORIF HUMERUS FRACTURE Left 03/28/2017  . ORIF HUMERUS FRACTURE Left 03/28/2017   Procedure: OPEN REDUCTION INTERNAL FIXATION (ORIF) LEFT PROXIMAL HUMERUS FRACTURE;  Surgeon: Teryl Lucy, MD;  Location: [facility] OR;  Service: Orthopedics;  Laterality: Left;    There were no vitals filed for this visit.   Subjective Assessment - 02/05/18 0757    Subjective  Patient fell off a truck in August of 2018. He fractured his left shoulder and had an ORIF. He reports the shoulder has hurt ever since. He was unable to go to therapy for financial reasons. He is having trouble using his left arm for ADL's. 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  Yes    Pain Score  10-Worst pain ever Pain reaches a 10/10 with activity    Pain Location  Shoulder    Pain Orientation  Left    Pain Descriptors / Indicators  Aching    Pain Type  Chronic pain    Pain Onset  More than a month ago    Pain Frequency  Constant    Aggravating Factors   use of the shoulder     Pain Relieving Factors  rest     Effect of Pain on Daily Activities  difficulty perfroming ADL's     Multiple Pain Sites  Yes    Pain Score  9    Pain Location  Hand    Pain Orientation  Left    Pain Descriptors / Indicators  Shooting    Pain Type  Chronic pain    Pain Onset  More than a month ago    Pain Frequency  Constant    Aggravating Factors   use of the hand     Pain Relieving Factors  rest, sleep     Effect of Pain on Daily Activities  difficulty perfroming daily tasks with his shoulder          [facility] PT Assessment - 02/05/18 0001      Assessment   Medical Diagnosis  Left Shoulder ORIF August 2018  Referring Provider  Dr Gershon Mussel     Onset Date/Surgical Date  03/28/18    Hand Dominance  Left    Next MD Visit  03/07/2018    Prior Therapy  None. ould not afford it.       Precautions   Precautions  None      Restrictions   Weight Bearing Restrictions  No      Balance Screen   Has the patient fallen in the past 6 months  Yes    How many times?  1 Patients knee gave out on him     Has the patient had a decrease in activity level because of a fear of falling?   No    Is the patient reluctant to leave their home because of a fear of falling?   No      Home Environment   Additional Comments  Nothing pertinant       Prior Function   Level of Independence  Independent    Vocation  Unemployed    Leisure  Painting       Cognition   Overall Cognitive Status  Within Functional Limits for tasks assessed    Attention  Focused    Focused Attention  Appears intact    Memory  Appears intact    Awareness  Appears intact    Problem Solving  Appears intact      Observation/Other Assessments   Observations  right shoulder elevated in  sitting       Sensation   Additional Comments  some numbness into the hand       Coordination   Gross Motor Movements are Fluid and Coordinated  Yes    Fine Motor Movements are Fluid and Coordinated  Yes      Posture/Postural Control   Posture Comments  forward head       ROM / Strength   AROM / PROM / Strength  AROM;PROM;Strength      AROM   Overall AROM Comments  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 patient reports it has been that way since the fall.     AROM Assessment Site  Shoulder    Right/Left Shoulder  Right;Left    Left Shoulder Flexion  85 Degrees    Left Shoulder ABduction  70 Degrees    Left Shoulder Internal Rotation  -- can reach to L3    Left Shoulder External Rotation  -- has to lean his head forward to reach the back of his head       PROM   PROM Assessment Site  Shoulder    Right/Left Shoulder  Right;Left    Right Shoulder Flexion  90 Degrees pain at end range     Right Shoulder ABduction  78 Degrees    Right Shoulder Internal Rotation  60 Degrees    Right Shoulder External Rotation  40 Degrees      Strength   Strength Assessment Site  Shoulder;Hand    Right/Left Shoulder  Right;Left    Right Shoulder Flexion  5/5    Right Shoulder ABduction  5/5    Right Shoulder Internal Rotation  5/5    Right Shoulder External Rotation  5/5    Left Shoulder Flexion  3/5 in availabel range     Left Shoulder ABduction  3/5    Left Shoulder Internal Rotation  4/5    Left Shoulder External Rotation  4/5    Right/Left hand  Left;Right    Right Hand Grip (lbs)  67    Left Hand Grip (lbs)  25      Palpation   Palpation comment  tenderness to palpation in the anterior shoulder and elbow and hand                 Objective measurements completed on examination: See above findings.      [facility] Adult PT Treatment/Exercise - 02/05/18 0001      Shoulder Exercises: Supine   Other Supine Exercises  supine wand ER x5 5sec hold     Other Supine Exercises  supine  wand flex with press 5x 5 sec hold       Shoulder Exercises: Standing   Other Standing Exercises  pendulums 10x cw ccw  flexion extneison/ horizontal abbduction/ adduction       Manual Therapy   Manual therapy comments  genetle PROM into flexion/ ER/ IR              PT Education - 02/05/18 1319    Education Details  improtance of increasing shoulder mobility; symptom mangement, HEP     Person(s) Educated  Patient    Methods  Explanation;Demonstration;Handout    Comprehension  Verbal cues required;Tactile cues required;Need further instruction       PT Short Term Goals - 02/05/18 1327      PT SHORT TERM GOAL #1   Title  Patient will demsotrate increased left shoulder passive flexion by 15 degrees     Time  4    Period  Weeks    Status  New    Target Date  03/05/18      PT SHORT TERM GOAL #2   Title  Patient will inncrease passive left shoulder ER by 10 degrees     Time  4    Period  Weeks    Status  New      PT SHORT TERM GOAL #3   Title  Patient will increase left shoulder flexion strength to 4/5 in available range     Time  4    Period  Weeks    Status  New        PT Long Term Goals - 02/05/18 1329      PT LONG TERM GOAL #1   Title  Patient will reach behind his head without increased pain and without putting his head forward in order to complete ADL's     Time  8    Period  Weeks    Status  New    Target Date  04/02/18      PT LONG TERM GOAL #2   Title  Patient will reach up to a cabinet without pain in order to perfrom ADL's     Time  8    Period  Weeks    Status  New    Target Date  04/02/18      PT LONG TERM GOAL #3   Title  Patient will sleep through the night without report of his shoulder waking him up     Time  8    Period  Weeks    Status  New    Target Date  04/02/18             Plan - 02/05/18 1321    Clinical Impression Statement  Patient is a 57 year old male with left shoulder pain and stiffness follwoing an ORIF on  03/28/2017. He was not able to do therapy for financial reasones. He has limited active and  passive shoulder motion. He also has pain and swelling in his hand. His x-ray revealed arthritis in is thumb and PIP's. He reports no pain in his hand prior to his fall. 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 He would benefit from skilled therapy to improve motion, strength, and function.  He was seen for a moderate complexity evaluation 2nd to multiple areas with symptoms in his arm and the fact that h eis losing mobility and function in his arm .    History and Personal Factors relevant to plan of care:  arthritis     Clinical Presentation  Evolving    Clinical Presentation due to:  loosing mobility and function of his left arm     Clinical Decision Making  Moderate    Rehab Potential  Fair    Clinical Impairments Affecting Rehab Potential  long standing motion limitations     PT Frequency  2x / week    PT Duration  8 weeks    PT Treatment/Interventions  ADLs/Self Care Home Management;Cryotherapy;Electrical Stimulation;Iontophoresis 4mg /ml Dexamethasone;Ultrasound;Traction;Functional mobility training;Therapeutic activities;Therapeutic exercise;Patient/family education;Manual techniques;Passive range of motion;Dry needling;Taping    PT Next Visit Plan  begin passive ROM and mobilizations to tolerance; consider adding scapualr strengthening; review wand exercises; add pulleys; modalities PRN; add scap retraction; consider putty exercises or towell squeeze for his hand?? hand was pretty swollen on inital eval    PT Home Exercise Plan  pendulums,     Consulted and Agree with Plan of Care  Patient       Patient will benefit from skilled therapeutic intervention in order to improve the following deficits and impairments:  Pain, Postural dysfunction, Impaired UE functional use, Decreased activity tolerance, Decreased endurance, Decreased range of motion, Decreased strength  Visit Diagnosis: Chronic  left shoulder pain  Stiffness of left shoulder, not elsewhere classified  Muscle weakness (generalized)     Problem List Patient Active Problem List   Diagnosis Date Noted  . Chronic left shoulder pain 01/17/2018  . Closed fracture of left proximal humerus 03/28/2017    Dessie Comaavid J Selenia Mihok PT DPT  02/05/2018, 2:29 PM  [facility] [address] [place], [place], [address] Phone: [phone]   Fax:  [phone]  Name: Lynford HumphreyStover L Linker MRN: [id number] Date of Birth: 10/19/1960

## 2018-02-10 ENCOUNTER — Ambulatory Visit: Payer: Self-pay | Attending: Orthopaedic Surgery | Admitting: Physical Therapy

## 2018-02-10 DIAGNOSIS — M25612 Stiffness of left shoulder, not elsewhere classified: Secondary | ICD-10-CM | POA: Insufficient documentation

## 2018-02-10 DIAGNOSIS — M25512 Pain in left shoulder: Secondary | ICD-10-CM | POA: Insufficient documentation

## 2018-02-10 DIAGNOSIS — G8929 Other chronic pain: Secondary | ICD-10-CM | POA: Insufficient documentation

## 2018-02-10 DIAGNOSIS — M6281 Muscle weakness (generalized): Secondary | ICD-10-CM | POA: Insufficient documentation

## 2018-02-11 ENCOUNTER — Encounter: Payer: Self-pay | Admitting: Physical Therapy

## 2018-02-11 ENCOUNTER — Ambulatory Visit: Payer: Self-pay | Admitting: Physical Therapy

## 2018-02-11 DIAGNOSIS — M25612 Stiffness of left shoulder, not elsewhere classified: Secondary | ICD-10-CM

## 2018-02-11 DIAGNOSIS — G8929 Other chronic pain: Secondary | ICD-10-CM

## 2018-02-11 DIAGNOSIS — M6281 Muscle weakness (generalized): Secondary | ICD-10-CM

## 2018-02-11 DIAGNOSIS — M25512 Pain in left shoulder: Principal | ICD-10-CM

## 2018-02-11 NOTE — Patient Instructions (Signed)
Squeeze a towel roll, hold 5 seconds, repeat 10 reps, 2 times per day.

## 2018-02-11 NOTE — Therapy (Signed)
[facility] [address] [place], [place], [address] Phone: [phone]   Fax:  [phone]  Physical Therapy Treatment  Patient Details  Name: Dan Clark MRN: [id number] Date of Birth: 10/30/1960 Referring Provider: Dr Gershon MusselNaiping Xu    Encounter Date: 02/11/2018  PT End of Session - 02/11/18 0933    Visit Number  2    Number of Visits  16    Date for PT Re-Evaluation  04/02/18    Authorization Type  Notes say CAFA; cvg says self pay note sent to front desk to clarify     PT Start Time  0927    PT Stop Time  1020    PT Time Calculation (min)  53 min       Past Medical History:  Diagnosis Date  . Arthritis   . Closed fracture of left proximal humerus 03/28/2017  . GERD (gastroesophageal reflux disease)     Past Surgical History:  Procedure Laterality Date  . MULTIPLE TOOTH EXTRACTIONS    . ORIF HUMERUS FRACTURE Left 03/28/2017  . ORIF HUMERUS FRACTURE Left 03/28/2017   Procedure: OPEN REDUCTION INTERNAL FIXATION (ORIF) LEFT PROXIMAL HUMERUS FRACTURE;  Surgeon: Teryl LucyLandau, Joshua, MD;  Location: [facility] OR;  Service: Orthopedics;  Laterality: Left;    There were no vitals filed for this visit.  Subjective Assessment - 02/11/18 0931    Subjective  Maybe I over did the exercises     Currently in Pain?  Yes    Pain Score  4     Pain Location  Shoulder    Pain Orientation  Left    Pain Descriptors / Indicators  Throbbing    Aggravating Factors   reaching up    Pain Relieving Factors  rest                        [facility] Adult PT Treatment/Exercise - 02/11/18 0001      Shoulder Exercises: Supine   Other Supine Exercises  Supine pressups, pullovers and ER x 10     Other Supine Exercises  Towel/grip  squeeze 5 sec x 10       Shoulder Exercises: Seated   Other Seated Exercises  Scap retract      Shoulder Exercises: Standing   Extension  15 reps red    Row  15 reps red      Shoulder Exercises: Pulleys   Flexion  2 minutes      Modalities   Modalities  Moist Heat      Moist Heat Therapy   Number Minutes Moist Heat  15 Minutes    Moist Heat Location  Shoulder      Manual Therapy   Manual Therapy  Soft tissue mobilization    Manual therapy comments  genetle PROM into flexion/abduction/ ER/ IR     Soft tissue mobilization  IASTM anterior shoulder              PT Education - 02/11/18 1027    Education Details  HEP    Person(s) Educated  Patient    Methods  Explanation;Demonstration    Comprehension  Verbalized understanding;Returned demonstration       PT Short Term Goals - 02/05/18 1327      PT SHORT TERM GOAL #1   Title  Patient will demsotrate increased left shoulder passive flexion by 15 degrees     Time  4    Period  Weeks    Status  New    Target Date  03/05/18      PT SHORT TERM GOAL #2   Title  Patient will inncrease passive left shoulder ER by 10 degrees     Time  4    Period  Weeks    Status  New      PT SHORT TERM GOAL #3   Title  Patient will increase left shoulder flexion strength to 4/5 in available range     Time  4    Period  Weeks    Status  New        PT Long Term Goals - 02/05/18 1329      PT LONG TERM GOAL #1   Title  Patient will reach behind his head without increased pain and without putting his head forward in order to complete ADL's     Time  8    Period  Weeks    Status  New    Target Date  04/02/18      PT LONG TERM GOAL #2   Title  Patient will reach up to a cabinet without pain in order to perfrom ADL's     Time  8    Period  Weeks    Status  New    Target Date  04/02/18      PT LONG TERM GOAL #3   Title  Patient will sleep through the night without report of his shoulder waking him up     Time  8    Period  Weeks    Status  New    Target Date  04/02/18            Plan - 02/11/18 1023    Clinical Impression Statement  Pt reports consistency with HEP. He demonstrates an increase in shoulder flexion ROM. Began  scapular strength and grip strength. Reviewed HEP. Soft tissue to anterior shoulder to decrease pain and restrictions. Trial of HMP at end of session.     PT Next Visit Plan  Update HEP ;begin passive ROM and mobilizations to tolerance; consider adding scapualr strengthening; review wand exercises; add pulleys; modalities PRN; add scap retraction; consider putty exercises or towell squeeze for his hand?? hand was pretty swollen on inital eval    PT Home Exercise Plan  pendulums, wand flexion, ER, thumb abduction stretch, towel squeeze        Patient will benefit from skilled therapeutic intervention in order to improve the following deficits and impairments:  Pain, Postural dysfunction, Impaired UE functional use, Decreased activity tolerance, Decreased endurance, Decreased range of motion, Decreased strength  Visit Diagnosis: Chronic left shoulder pain  Stiffness of left shoulder, not elsewhere classified  Muscle weakness (generalized)     Problem List Patient Active Problem List   Diagnosis Date Noted  . Chronic left shoulder pain 01/17/2018  . Closed fracture of left proximal humerus 03/28/2017    Sherrie Mustache, PTA 02/11/2018, 10:27 AM  [facility] [address] [place], [place], [address] Phone: [phone]   Fax:  [phone]  Name: Dan Clark MRN: [id number] Date of Birth: 1961-04-15

## 2018-02-18 ENCOUNTER — Ambulatory Visit: Payer: Self-pay | Admitting: Physical Therapy

## 2018-02-24 ENCOUNTER — Encounter: Payer: Self-pay | Admitting: Physical Therapy

## 2018-02-24 ENCOUNTER — Ambulatory Visit: Payer: Self-pay | Admitting: Physical Therapy

## 2018-02-24 DIAGNOSIS — M6281 Muscle weakness (generalized): Secondary | ICD-10-CM

## 2018-02-24 DIAGNOSIS — G8929 Other chronic pain: Secondary | ICD-10-CM

## 2018-02-24 DIAGNOSIS — M25612 Stiffness of left shoulder, not elsewhere classified: Secondary | ICD-10-CM

## 2018-02-24 DIAGNOSIS — M25512 Pain in left shoulder: Principal | ICD-10-CM

## 2018-02-24 NOTE — Patient Instructions (Signed)
   Side Pull: Double Arm   On back, knees bent, feet flat. Arms perpendicular to body, shoulder level, elbows straight but relaxed. Pull arms out to sides, elbows straight. Resistance band comes across collarbones, hands toward floor. Hold momentarily. Slowly return to starting position. Repeat __10-20_ times. Band color _yellow____  2 times per day.   Shoulder Rotation: Double Arm   On back, knees bent, feet flat, elbows tucked at sides, bent 90, hands palms up. Pull hands apart and down toward floor, keeping elbows near sides. Hold momentarily. Slowly return to starting position. Repeat __10-20_ times. Band color __yellow____  2 times per day   Copyright  VHI. All rights reserved.  Resistive Band Rowing   With resistive band anchored in door, grasp both ends. Keeping elbows bent, pull back, squeezing shoulder blades together. Hold _5___ seconds. Repeat _10-20___ times. Do __2__ sessions per day.

## 2018-02-24 NOTE — Therapy (Signed)
[facility] [address] [place], [place], [address] Phone: [phone]   Fax:  [phone]  Physical Therapy Treatment  Patient Details  Name: Dan Clark MRN: [id number] Date of Birth: 02/09/1961 Referring Provider: Dr Gershon Mussel    Encounter Date: 02/24/2018  PT End of Session - 02/24/18 0851    Visit Number  3    Number of Visits  16    Date for PT Re-Evaluation  04/02/18    Authorization Type  Notes say CAFA; cvg says self pay note sent to front desk to clarify     PT Start Time  0845    PT Stop Time  0933    PT Time Calculation (min)  48 min       Past Medical History:  Diagnosis Date  . Arthritis   . Closed fracture of left proximal humerus 03/28/2017  . GERD (gastroesophageal reflux disease)     Past Surgical History:  Procedure Laterality Date  . MULTIPLE TOOTH EXTRACTIONS    . ORIF HUMERUS FRACTURE Left 03/28/2017  . ORIF HUMERUS FRACTURE Left 03/28/2017   Procedure: OPEN REDUCTION INTERNAL FIXATION (ORIF) LEFT PROXIMAL HUMERUS FRACTURE;  Surgeon: Teryl Lucy, MD;  Location: [facility] OR;  Service: Orthopedics;  Laterality: Left;    There were no vitals filed for this visit.  Subjective Assessment - 02/24/18 0847    Subjective  I have something at home I have been using to stretch     Currently in Pain?  Yes    Pain Score  3     Pain Location  Shoulder    Pain Orientation  Left    Pain Descriptors / Indicators  Throbbing    Aggravating Factors   reach up     Pain Relieving Factors  rest          [facility] PT Assessment - 02/24/18 0001      PROM   Right Shoulder Flexion  97 Degrees                   [facility] Adult PT Treatment/Exercise - 02/24/18 0001      Shoulder Exercises: Supine   Horizontal ABduction  10 reps;Theraband    Theraband Level (Shoulder Horizontal ABduction)  Level 1 (Yellow)    External Rotation  Strengthening;20 reps;Theraband    Theraband Level (Shoulder External  Rotation)  Level 1 (Yellow)    Other Supine Exercises  Supine pressups, pullovers and ER x 10     Other Supine Exercises  supine yellow band bicep and tricep strengthening, PTA holding band       Shoulder Exercises: Sidelying   Other Sidelying Exercises  scap depression      Shoulder Exercises: Standing   Row  15 reps yellow + HEP      Shoulder Exercises: Pulleys   Flexion  2 minutes      Shoulder Exercises: Isometric Strengthening   Other Isometric Exercises  manual resistance for IR and ER isometrics       Shoulder Exercises: Stretch   Other Shoulder Stretches  self flexion stretch using opposite UE supine       Moist Heat Therapy   Number Minutes Moist Heat  10 Minutes    Moist Heat Location  Shoulder      Manual Therapy   Manual Therapy  Joint mobilization    Manual therapy comments  genetle PROM into flexion/abduction/ ER/ IR     Joint Mobilization  grade II A/P and inferior  glides, scapular mobs              PT Education - 02/24/18 0924    Education Details  HEP    Person(s) Educated  Patient    Methods  Explanation;Handout    Comprehension  Verbalized understanding       PT Short Term Goals - 02/05/18 1327      PT SHORT TERM GOAL #1   Title  Patient will demsotrate increased left shoulder passive flexion by 15 degrees     Time  4    Period  Weeks    Status  New    Target Date  03/05/18      PT SHORT TERM GOAL #2   Title  Patient will inncrease passive left shoulder ER by 10 degrees     Time  4    Period  Weeks    Status  New      PT SHORT TERM GOAL #3   Title  Patient will increase left shoulder flexion strength to 4/5 in available range     Time  4    Period  Weeks    Status  New        PT Long Term Goals - 02/05/18 1329      PT LONG TERM GOAL #1   Title  Patient will reach behind his head without increased pain and without putting his head forward in order to complete ADL's     Time  8    Period  Weeks    Status  New    Target Date   04/02/18      PT LONG TERM GOAL #2   Title  Patient will reach up to a cabinet without pain in order to perfrom ADL's     Time  8    Period  Weeks    Status  New    Target Date  04/02/18      PT LONG TERM GOAL #3   Title  Patient will sleep through the night without report of his shoulder waking him up     Time  8    Period  Weeks    Status  New    Target Date  04/02/18            Plan - 02/24/18 1102    Clinical Impression Statement  Improvement in flexion AROM. Able to begin yellow band scapular bands without increased pain.     PT Next Visit Plan  Update HEP ;begin passive ROM and mobilizations to tolerance; consider adding scapualr strengthening; review wand exercises; add pulleys; modalities PRN; add scap retraction; consider putty exercises or towell squeeze for his hand?? hand was pretty swollen on inital eval    PT Home Exercise Plan  pendulums, wand flexion, ER, thumb abduction stretch, towel squeeze , supine horizontal abduction and ER, standing rows     Consulted and Agree with Plan of Care  Patient       Patient will benefit from skilled therapeutic intervention in order to improve the following deficits and impairments:  Pain, Postural dysfunction, Impaired UE functional use, Decreased activity tolerance, Decreased endurance, Decreased range of motion, Decreased strength  Visit Diagnosis: Chronic left shoulder pain  Stiffness of left shoulder, not elsewhere classified  Muscle weakness (generalized)     Problem List Patient Active Problem List   Diagnosis Date Noted  . Chronic left shoulder pain 01/17/2018  . Closed fracture of left proximal humerus 03/28/2017    Onia Shiflett,  Orpah CobbJessica McGee, PTA 02/24/2018, 11:05 AM  [facility] [address] [place], [place], [address] Phone: [phone]   Fax:  [phone]  Name: Dan Clark MRN: [id number] Date of Birth: 11/23/1960

## 2018-02-26 ENCOUNTER — Ambulatory Visit: Payer: Self-pay | Admitting: Physical Therapy

## 2018-02-26 ENCOUNTER — Encounter: Payer: Self-pay | Admitting: Physical Therapy

## 2018-02-26 DIAGNOSIS — M6281 Muscle weakness (generalized): Secondary | ICD-10-CM

## 2018-02-26 DIAGNOSIS — G8929 Other chronic pain: Secondary | ICD-10-CM

## 2018-02-26 DIAGNOSIS — M25512 Pain in left shoulder: Principal | ICD-10-CM

## 2018-02-26 DIAGNOSIS — M25612 Stiffness of left shoulder, not elsewhere classified: Secondary | ICD-10-CM

## 2018-02-26 NOTE — Therapy (Signed)
[facility] [address] [place], [place], [address] Phone: [phone]   Fax:  [phone]  Physical Therapy Treatment  Patient Details  Name: Dan Clark MRN: [id number] Date of Birth: 03/05/1961 Referring Provider: Dr Gershon MusselNaiping Xu    Encounter Date: 02/26/2018  PT End of Session - 02/26/18 0851    Visit Number  4    Number of Visits  16    Date for PT Re-Evaluation  04/02/18    Authorization Type  Notes say CAFA; cvg says self pay note sent to front desk to clarify     PT Start Time  0849    PT Stop Time  0940    PT Time Calculation (min)  51 min       Past Medical History:  Diagnosis Date  . Arthritis   . Closed fracture of left proximal humerus 03/28/2017  . GERD (gastroesophageal reflux disease)     Past Surgical History:  Procedure Laterality Date  . MULTIPLE TOOTH EXTRACTIONS    . ORIF HUMERUS FRACTURE Left 03/28/2017  . ORIF HUMERUS FRACTURE Left 03/28/2017   Procedure: OPEN REDUCTION INTERNAL FIXATION (ORIF) LEFT PROXIMAL HUMERUS FRACTURE;  Surgeon: Teryl LucyLandau, Joshua, MD;  Location: [facility] OR;  Service: Orthopedics;  Laterality: Left;    There were no vitals filed for this visit.  Subjective Assessment - 02/26/18 0851    Currently in Pain?  Yes    Pain Score  7     Pain Location  Shoulder    Pain Orientation  Left    Pain Descriptors / Indicators  Throbbing    Pain Type  Chronic pain    Aggravating Factors   reaching, sleeping wrong     Pain Relieving Factors  rest         [facility] PT Assessment - 02/26/18 0001      AROM   Left Shoulder Flexion  90 Degrees                   [facility] Adult PT Treatment/Exercise - 02/26/18 0001      Shoulder Exercises: Supine   Protraction  10 reps;AROM    Horizontal ABduction  10 reps;Theraband    Theraband Level (Shoulder Horizontal ABduction)  Level 1 (Yellow)    External Rotation  Strengthening;20 reps;Theraband    Theraband Level (Shoulder External  Rotation)  Level 1 (Yellow)    Other Supine Exercises  Supine pressups, pullovers and ER x 10     Other Supine Exercises  supine yellow band bicep and tricep strengthening, PTA holding band       Shoulder Exercises: Sidelying   External Rotation  15 reps;AROM    Other Sidelying Exercises  scap depression      Shoulder Exercises: Standing   Row  15 reps red    Other Standing Exercises  wall slids in pillow case     Other Standing Exercises  finger ladder flexion       Shoulder Exercises: Pulleys   Flexion  2 minutes      Moist Heat Therapy   Number Minutes Moist Heat  10 Minutes    Moist Heat Location  Shoulder      Manual Therapy   Manual therapy comments  genetle PROM into flexion/abduction/ ER/ IR     Joint Mobilization  grade II A/P and inferior glides, scapular mobs                PT Short Term Goals - 02/05/18 1327  PT SHORT TERM GOAL #1   Title  Patient will demsotrate increased left shoulder passive flexion by 15 degrees     Time  4    Period  Weeks    Status  New    Target Date  03/05/18      PT SHORT TERM GOAL #2   Title  Patient will inncrease passive left shoulder ER by 10 degrees     Time  4    Period  Weeks    Status  New      PT SHORT TERM GOAL #3   Title  Patient will increase left shoulder flexion strength to 4/5 in available range     Time  4    Period  Weeks    Status  New        PT Long Term Goals - 02/05/18 1329      PT LONG TERM GOAL #1   Title  Patient will reach behind his head without increased pain and without putting his head forward in order to complete ADL's     Time  8    Period  Weeks    Status  New    Target Date  04/02/18      PT LONG TERM GOAL #2   Title  Patient will reach up to a cabinet without pain in order to perfrom ADL's     Time  8    Period  Weeks    Status  New    Target Date  04/02/18      PT LONG TERM GOAL #3   Title  Patient will sleep through the night without report of his shoulder waking  him up     Time  8    Period  Weeks    Status  New    Target Date  04/02/18            Plan - 02/26/18 0854    Clinical Impression Statement  Pt reports increased throbbing in shoulder. He reports feeling okay after last visit and does not think the bands caused his increase in pain. Continued with PROM, AAROM and gentle strengthening. HMP at end of session. Some pain with end range flexion and abduction stretching.     PT Next Visit Plan  Update HEP ;begin passive ROM and mobilizations to tolerance; consider adding scapualr strengthening; review wand exercises; add pulleys; modalities PRN; add scap retraction; consider putty exercises or towell squeeze for his hand?? hand was pretty swollen on inital eval    PT Home Exercise Plan  pendulums, wand flexion, ER, thumb abduction stretch, towel squeeze , supine horizontal abduction and ER, standing rows     Consulted and Agree with Plan of Care  Patient       Patient will benefit from skilled therapeutic intervention in order to improve the following deficits and impairments:  Pain, Postural dysfunction, Impaired UE functional use, Decreased activity tolerance, Decreased endurance, Decreased range of motion, Decreased strength  Visit Diagnosis: Chronic left shoulder pain  Stiffness of left shoulder, not elsewhere classified  Muscle weakness (generalized)     Problem List Patient Active Problem List   Diagnosis Date Noted  . Chronic left shoulder pain 01/17/2018  . Closed fracture of left proximal humerus 03/28/2017    Sherrie Mustache, PTA 02/26/2018, 9:35 AM  [facility] [address] [place], [place], [address] Phone: [phone]   Fax:  [phone]  Name: Dan Clark MRN: [id number] Date  of Birth: [birth year]

## 2018-03-03 ENCOUNTER — Ambulatory Visit: Payer: Self-pay | Admitting: Physical Therapy

## 2018-03-03 ENCOUNTER — Encounter: Payer: Self-pay | Admitting: Physical Therapy

## 2018-03-03 DIAGNOSIS — M25512 Pain in left shoulder: Principal | ICD-10-CM

## 2018-03-03 DIAGNOSIS — M25612 Stiffness of left shoulder, not elsewhere classified: Secondary | ICD-10-CM

## 2018-03-03 DIAGNOSIS — G8929 Other chronic pain: Secondary | ICD-10-CM

## 2018-03-03 DIAGNOSIS — M6281 Muscle weakness (generalized): Secondary | ICD-10-CM

## 2018-03-04 NOTE — Therapy (Signed)
[facility] [address] [place], [place], [address] Phone: [phone]   Fax:  [phone]  Physical Therapy Treatment  Patient Details  Name: Dan Clark MRN: [id number] Date of Birth: 12/27/1960 Referring Provider: Dr Gershon MusselNaiping Xu    Encounter Date: 03/03/2018  PT End of Session - 03/03/18 0927    Visit Number  5    Number of Visits  16    Date for PT Re-Evaluation  04/02/18    Authorization Type  Notes say CAFA; cvg says self pay note sent to front desk to clarify     PT Start Time  0847    PT Stop Time  0930    PT Time Calculation (min)  43 min    Activity Tolerance  Patient tolerated treatment well    Behavior During Therapy  [facility] for tasks assessed/performed       Past Medical History:  Diagnosis Date  . Arthritis   . Closed fracture of left proximal humerus 03/28/2017  . GERD (gastroesophageal reflux disease)     Past Surgical History:  Procedure Laterality Date  . MULTIPLE TOOTH EXTRACTIONS    . ORIF HUMERUS FRACTURE Left 03/28/2017  . ORIF HUMERUS FRACTURE Left 03/28/2017   Procedure: OPEN REDUCTION INTERNAL FIXATION (ORIF) LEFT PROXIMAL HUMERUS FRACTURE;  Surgeon: Teryl LucyLandau, Joshua, MD;  Location: [facility] OR;  Service: Orthopedics;  Laterality: Left;    There were no vitals filed for this visit.  Subjective Assessment - 03/03/18 0850    Subjective  Patient reports some ups and downs over the weekend. His pain level is about a 4/10. He continues to have some pain in his hand.     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  Yes    Pain Score  4     Pain Location  Shoulder    Pain Orientation  Left    Pain Descriptors / Indicators  Aching    Pain Type  Chronic pain    Pain Onset  More than a month ago    Pain Frequency  Constant    Aggravating Factors    reaching, sleeping     Pain Relieving Factors  rest     Effect of Pain on Daily Activities  difficulty perfroming ADL;s     Multiple Pain Sites  Yes    Pain Score  5    Pain Location  Hand    Pain Orientation  Left    Pain Descriptors / Indicators  Shooting    Pain Type  Chronic pain    Pain Onset  More than a month ago    Pain Frequency  Constant    Aggravating Factors   useof     Pain Relieving Factors  rest     Effect of Pain on Daily Activities  difficulty holding things                        [facility] Treatment/Exercise - 03/04/18 0001      Shoulder Exercises: Supine   Protraction  10 reps;AROM    Horizontal ABduction  10 reps;Theraband    Theraband Level (Shoulder Horizontal ABduction)  Level 1 (Yellow)    External Rotation  Strengthening;20 reps;Theraband    Theraband Level (Shoulder External Rotation)  Level 1 (Yellow)  Other Supine Exercises  Supine pressups, pullovers and ER x 10     Other Supine Exercises  supine yellow band bicep and tricep strengthening, PTA holding band x10 each       Shoulder Exercises: Sidelying   External Rotation  15 reps;AROM    Other Sidelying Exercises  scap depression      Shoulder Exercises: Standing   Extension  20 reps;Theraband    Theraband Level (Shoulder Extension)  Level 2 (Red)    Row  20 reps red      Shoulder Exercises: Pulleys   Flexion  2 minutes      Hand Exercises   Other Hand Exercises  putty pinch x10; putty pull x10; putty squeeze x10       Manual Therapy   Manual therapy comments  genetle PROM into flexion/abduction/ ER/ IR     Joint Mobilization  grade II A/P and inferior glides, scapular mobs              PT Education - 03/03/18 0926    Education Details  updated HEP for putty exeercises     Methods  Explanation;Demonstration;Verbal cues;Tactile cues    Comprehension  Verbalized understanding;Returned demonstration       PT Short Term Goals - 03/03/18 1003      PT SHORT  TERM GOAL #1   Title  Patient will demsotrate increased left shoulder passive flexion by 15 degrees     Baseline  progressing per visual inspection     Time  4    Period  Weeks    Status  On-going      PT SHORT TERM GOAL #2   Title  Patient will inncrease passive left shoulder ER by 10 degrees     Baseline  per visual inspection progressing well     Time  4    Period  Days    Status  On-going      PT SHORT TERM GOAL #3   Title  Patient will increase left shoulder flexion strength to 4/5 in available range     Baseline  working on strengthening     Time  4    Period  Weeks    Status  On-going        PT Long Term Goals - 02/05/18 1329      PT LONG TERM GOAL #1   Title  Patient will reach behind his head without increased pain and without putting his head forward in order to complete ADL's     Time  8    Period  Weeks    Status  New    Target Date  04/02/18      PT LONG TERM GOAL #2   Title  Patient will reach up to a cabinet without pain in order to perfrom ADL's     Time  8    Period  Weeks    Status  New    Target Date  04/02/18      PT LONG TERM GOAL #3   Title  Patient will sleep through the night without report of his shoulder waking him up     Time  8    Period  Weeks    Status  New    Target Date  04/02/18            Plan - 03/03/18 6962    Clinical Impression Statement  Patient continues to have pain but per visual inspectionhis range is improving. He is  tolerated his exercises well. Therapy added putty exercises for his thumb. He had minor pain but it did not last. He was given the putty for home.     Clinical Presentation  Evolving    Clinical Decision Making  Moderate    Rehab Potential  Fair    Clinical Impairments Affecting Rehab Potential  long standing motion limitations     PT Frequency  2x / week    PT Duration  8 weeks    PT Treatment/Interventions  ADLs/Self Care Home Management;Cryotherapy;Electrical Stimulation;Iontophoresis 4mg /ml  Dexamethasone;Ultrasound;Traction;Functional mobility training;Therapeutic activities;Therapeutic exercise;Patient/family education;Manual techniques;Passive range of motion;Dry needling;Taping    PT Next Visit Plan  Update HEP ;begin passive ROM and mobilizations to tolerance; consider adding scapualr strengthening; review wand exercises; add pulleys; modalities PRN; add scap retraction; consider putty exercises or towell squeeze for his hand?? hand was pretty swollen on inital eval    PT Home Exercise Plan  pendulums, wand flexion, ER, thumb abduction stretch, towel squeeze , supine horizontal abduction and ER, standing rows     Consulted and Agree with Plan of Care  Patient       Patient will benefit from skilled therapeutic intervention in order to improve the following deficits and impairments:  Pain, Postural dysfunction, Impaired UE functional use, Decreased activity tolerance, Decreased endurance, Decreased range of motion, Decreased strength  Visit Diagnosis: Chronic left shoulder pain  Stiffness of left shoulder, not elsewhere classified  Muscle weakness (generalized)     Problem List Patient Active Problem List   Diagnosis Date Noted  . Chronic left shoulder pain 01/17/2018  . Closed fracture of left proximal humerus 03/28/2017    Dessie Coma PT DPT  03/04/2018, 8:05 AM  [facility] [address] [place], [place], [address] Phone: [phone]   Fax:  [phone]  Name: Dan Clark MRN: [id number] Date of Birth: 1961-05-27

## 2018-03-05 ENCOUNTER — Encounter: Payer: Self-pay | Admitting: Physical Therapy

## 2018-03-05 ENCOUNTER — Ambulatory Visit: Payer: Self-pay | Admitting: Physical Therapy

## 2018-03-05 DIAGNOSIS — M6281 Muscle weakness (generalized): Secondary | ICD-10-CM

## 2018-03-05 DIAGNOSIS — M25612 Stiffness of left shoulder, not elsewhere classified: Secondary | ICD-10-CM

## 2018-03-05 DIAGNOSIS — G8929 Other chronic pain: Secondary | ICD-10-CM

## 2018-03-05 DIAGNOSIS — M25512 Pain in left shoulder: Principal | ICD-10-CM

## 2018-03-06 NOTE — Therapy (Signed)
[facility] [address] [place], [place], [address] Phone: [phone]   Fax:  [phone]  Physical Therapy Treatment  Patient Details  Name: Dan Clark MRN: [id number] Date of Birth: 11/18/1960 Referring Provider: Dr Gershon MusselNaiping Xu    Encounter Date: 03/05/2018  PT End of Session - 03/05/18 0854    Visit Number  6    Number of Visits  16    Date for PT Re-Evaluation  04/02/18    Authorization Type  Notes say CAFA; cvg says self pay note sent to front desk to clarify     PT Start Time  0846    PT Stop Time  0927    PT Time Calculation (min)  41 min    Activity Tolerance  Patient tolerated treatment well    Behavior During Therapy  [facility] for tasks assessed/performed       Past Medical History:  Diagnosis Date  . Arthritis   . Closed fracture of left proximal humerus 03/28/2017  . GERD (gastroesophageal reflux disease)     Past Surgical History:  Procedure Laterality Date  . MULTIPLE TOOTH EXTRACTIONS    . ORIF HUMERUS FRACTURE Left 03/28/2017  . ORIF HUMERUS FRACTURE Left 03/28/2017   Procedure: OPEN REDUCTION INTERNAL FIXATION (ORIF) LEFT PROXIMAL HUMERUS FRACTURE;  Surgeon: Teryl LucyLandau, Joshua, MD;  Location: [facility] OR;  Service: Orthopedics;  Laterality: Left;    There were no vitals filed for this visit.  Subjective Assessment - 03/05/18 0850    Subjective  Patient feels like when he does the putty he has pain that shoots up his shoulder and into his neck. His painlevel has been higher over the past few days.     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  Yes    Pain Score  7     Pain Orientation  Left    Pain Descriptors / Indicators  Aching    Pain Type  Chronic pain    Pain Onset  More than a month ago    Pain Frequency  Constant    Aggravating Factors   reaching  and sleeping     Pain Relieving Factors  rest     Effect of Pain on Daily Activities  difficulty holding items     Pain Score  7    Pain Location  Hand    Pain Orientation  Left    Pain Descriptors / Indicators  Shooting    Pain Type  Chronic pain    Pain Onset  Today    Aggravating Factors   use of the hand     Pain Relieving Factors  rest     Effect of Pain on Daily Activities  difficulty holding onto items                        [facility] Treatment/Exercise - 03/06/18 0001      Shoulder Exercises: Supine   Protraction  10 reps;AROM    Horizontal ABduction  10 reps;Theraband    Theraband Level (Shoulder Horizontal ABduction)  Level 1 (Yellow)    External Rotation  Strengthening;20 reps;Theraband    Theraband Level (Shoulder External Rotation)  Level 1 (Yellow)    Other Supine Exercises  Supine pressups, pullovers and ER x 10; supine flexion/ extension  in low range 1lb 2x10; abduction/ adduction 2x10 1lb     Other Supine Exercises  supine yellow band bicep and tricep strengthening, PTA holding band x10 each       Shoulder Exercises: Sidelying   External Rotation  15 reps;AROM    Other Sidelying Exercises  scap depression      Shoulder Exercises: Standing   Extension  20 reps;Theraband    Theraband Level (Shoulder Extension)  Level 2 (Red)    Row  20 reps red      Shoulder Exercises: Pulleys   Flexion  2 minutes      Hand Exercises   Other Hand Exercises  putty pinch x10; putty pull x10; putty squeeze x10       Manual Therapy   Manual therapy comments  genetle PROM into flexion/abduction/ ER/ IR     Joint Mobilization  grade II A/P and inferior glides, scapular mobs              PT Education - 03/05/18 0853    Education Details  reviewed shoulder stretching exercises     Person(s) Educated  Patient    Methods  Explanation;Demonstration;Tactile cues;Verbal cues    Comprehension  Verbalized understanding;Returned demonstration;Verbal cues  required;Tactile cues required       PT Short Term Goals - 03/03/18 1003      PT SHORT TERM GOAL #1   Title  Patient will demsotrate increased left shoulder passive flexion by 15 degrees     Baseline  progressing per visual inspection     Time  4    Period  Weeks    Status  On-going      PT SHORT TERM GOAL #2   Title  Patient will inncrease passive left shoulder ER by 10 degrees     Baseline  per visual inspection progressing well     Time  4    Period  Days    Status  On-going      PT SHORT TERM GOAL #3   Title  Patient will increase left shoulder flexion strength to 4/5 in available range     Baseline  working on strengthening     Time  4    Period  Weeks    Status  On-going        PT Long Term Goals - 02/05/18 1329      PT LONG TERM GOAL #1   Title  Patient will reach behind his head without increased pain and without putting his head forward in order to complete ADL's     Time  8    Period  Weeks    Status  New    Target Date  04/02/18      PT LONG TERM GOAL #2   Title  Patient will reach up to a cabinet without pain in order to perfrom ADL's     Time  8    Period  Weeks    Status  New    Target Date  04/02/18      PT LONG TERM GOAL #3   Title  Patient will sleep through the night without report of his shoulder waking him up     Time  8    Period  Weeks    Status  New    Target Date  04/02/18            Plan - 03/05/18 0918    Clinical Impression Statement  Patient had spasming into his neck  today. Therapy perfromed trigger point release to upper trap and lower cervical spine. He was also given new light supine strengthening. He had more pain in his hand then his shoulder. His range was limited in the beggining but improved with stretching .     Clinical Presentation  Evolving    Clinical Decision Making  Moderate    Rehab Potential  Good    PT Frequency  2x / week    PT Duration  8 weeks    PT Treatment/Interventions  ADLs/Self Care Home  Management;Cryotherapy;Electrical Stimulation;Iontophoresis 4mg /ml Dexamethasone;Ultrasound;Traction;Functional mobility training;Therapeutic activities;Therapeutic exercise;Patient/family education;Manual techniques;Passive range of motion;Dry needling;Taping    PT Next Visit Plan  Update HEP ;begin passive ROM and mobilizations to tolerance; consider adding scapualr strengthening; review wand exercises; add pulleys; modalities PRN; add scap retraction; consider putty exercises or towell squeeze for his hand?? hand was pretty swollen on inital eval    PT Home Exercise Plan  pendulums, wand flexion, ER, thumb abduction stretch, towel squeeze , supine horizontal abduction and ER, standing rows     Consulted and Agree with Plan of Care  Patient       Patient will benefit from skilled therapeutic intervention in order to improve the following deficits and impairments:  Pain, Postural dysfunction, Impaired UE functional use, Decreased activity tolerance, Decreased endurance, Decreased range of motion, Decreased strength  Visit Diagnosis: Chronic left shoulder pain  Stiffness of left shoulder, not elsewhere classified  Muscle weakness (generalized)     Problem List Patient Active Problem List   Diagnosis Date Noted  . Chronic left shoulder pain 01/17/2018  . Closed fracture of left proximal humerus 03/28/2017    Dessie Coma PT DPT  03/06/2018, 10:15 AM  [facility] [address] [place], [place], [address] Phone: [phone]   Fax:  [phone]  Name: Dan Clark MRN: [id number] Date of Birth: April 29, 1961

## 2018-03-07 ENCOUNTER — Encounter (INDEPENDENT_AMBULATORY_CARE_PROVIDER_SITE_OTHER): Payer: Self-pay | Admitting: Physician Assistant

## 2018-03-07 ENCOUNTER — Ambulatory Visit (INDEPENDENT_AMBULATORY_CARE_PROVIDER_SITE_OTHER): Payer: Self-pay | Admitting: Physician Assistant

## 2018-03-07 DIAGNOSIS — G8929 Other chronic pain: Secondary | ICD-10-CM

## 2018-03-07 DIAGNOSIS — M25512 Pain in left shoulder: Secondary | ICD-10-CM

## 2018-03-07 MED ORDER — MELOXICAM 7.5 MG PO TABS: 7.5000 mg | ORAL_TABLET | Freq: Every day | ORAL | 2 refills | Status: DC | PRN

## 2018-03-07 NOTE — Progress Notes (Signed)
      Patient: Dan Clark           Date of Birth: 04/14/1961           MRN: [id number] Visit Date: 03/07/2018 PCP: Loletta SpecterGomez, Roger David, PA-C   Assessment & Plan:  Chief Complaint:  Chief Complaint  Patient presents with  . Left Shoulder - Follow-up   Visit Diagnoses:  1. Chronic left shoulder pain     Plan: Patient is a pleasant 57 year old gentleman who returns to our clinic for follow-up of his left shoulder.  He is status post ORIF left proximal humerus fracture by Dr. Dion SaucierLandau in August 2018.  He never went to formal physical therapy to regain motion and strength.  He saw us in June of this past year with continued left shoulder pain, loss of motion and swelling to the left hand.  Arthritis panel obtained ROS which was negative.  We sent him to formal physical therapy where he is started to regain moderate amount of motion.  His swelling has also decreased.  Overall feeling much better.  Examination of the left upper extremity reveals mild tenderness over the proximal humerus.  60 to 70% active range of motion.  Minimal swelling to the left hand.  He is neurovascularly intact distally.  At this point, I would like for the patient to continue with formal physical therapy.  He will follow-up with us in 6 weeks time for recheck.  Call with concerns or questions in the meantime.  Follow-Up Instructions: Return in about 6 weeks (around 04/18/2018).   Orders:  No orders of the defined types were placed in this encounter.  Meds ordered this encounter  Medications  . meloxicam (MOBIC) 7.5 MG tablet    Sig: Take 1 tablet (7.5 mg total) by mouth daily as needed for up to 30 doses for pain.    Dispense:  30 tablet    Refill:  2    Imaging: No results found.  PMFS History: Patient Active Problem List   Diagnosis Date Noted  . Chronic left shoulder pain 01/17/2018  . Closed fracture of left proximal humerus 03/28/2017   Past Medical History:  Diagnosis Date  . Arthritis   .  Closed fracture of left proximal humerus 03/28/2017  . GERD (gastroesophageal reflux disease)     History reviewed. No pertinent family history.  Past Surgical History:  Procedure Laterality Date  . MULTIPLE TOOTH EXTRACTIONS    . ORIF HUMERUS FRACTURE Left 03/28/2017  . ORIF HUMERUS FRACTURE Left 03/28/2017   Procedure: OPEN REDUCTION INTERNAL FIXATION (ORIF) LEFT PROXIMAL HUMERUS FRACTURE;  Surgeon: Teryl LucyLandau, Joshua, MD;  Location: [facility] OR;  Service: Orthopedics;  Laterality: Left;   Social History   Occupational History  . Not on file  Tobacco Use  . Smoking status: Current Every Day Smoker    Years: 20.00    Types: Cigars  . Smokeless tobacco: Never Used  Substance and Sexual Activity  . Alcohol use: Yes    Alcohol/week: 8.4 oz    Types: 14 Cans of beer per week  . Drug use: Yes    Comment: past cocaine & marijuana use  . Sexual activity: Yes

## 2018-03-10 ENCOUNTER — Other Ambulatory Visit: Payer: Self-pay

## 2018-03-10 ENCOUNTER — Ambulatory Visit (INDEPENDENT_AMBULATORY_CARE_PROVIDER_SITE_OTHER): Payer: Self-pay | Admitting: Physician Assistant

## 2018-03-10 ENCOUNTER — Encounter (INDEPENDENT_AMBULATORY_CARE_PROVIDER_SITE_OTHER): Payer: Self-pay | Admitting: Physician Assistant

## 2018-03-10 VITALS — BP 153/82 | HR 66 | Temp 98.0°F | Ht 71.5 in | Wt 129.4 lb

## 2018-03-10 DIAGNOSIS — G8929 Other chronic pain: Secondary | ICD-10-CM

## 2018-03-10 DIAGNOSIS — I10 Essential (primary) hypertension: Secondary | ICD-10-CM

## 2018-03-10 DIAGNOSIS — M25512 Pain in left shoulder: Secondary | ICD-10-CM

## 2018-03-10 MED ORDER — AMLODIPINE BESYLATE 10 MG PO TABS: 10.0000 mg | ORAL_TABLET | Freq: Every day | ORAL | 1 refills | Status: DC

## 2018-03-10 MED ORDER — CODEINE SULFATE 30 MG PO TABS: 30.0000 mg | ORAL_TABLET | Freq: Three times a day (TID) | ORAL | 0 refills | Status: AC | PRN

## 2018-03-10 MED ORDER — MELOXICAM 7.5 MG PO TABS: 7.5000 mg | ORAL_TABLET | Freq: Every day | ORAL | 0 refills | Status: DC | PRN

## 2018-03-10 MED FILL — AMLODIPINE BESYLATE 10 MG T: 10 | 30 days supply | Qty: 30 | Fill #0

## 2018-03-10 MED FILL — MELOXICAM 7.5 MG TABLET: 7.5 | 30 days supply | Qty: 30 | Fill #0

## 2018-03-10 NOTE — Patient Instructions (Signed)
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
## 2018-03-11 ENCOUNTER — Ambulatory Visit: Payer: Self-pay | Admitting: Physical Therapy

## 2018-03-11 LAB — LIPID PANEL
Chol/HDL Ratio: 1.5 ratio (ref 0.0–5.0)
Cholesterol, Total: 210 mg/dL — ABNORMAL HIGH (ref 100–199)
HDL: 143 mg/dL (ref >39)
LDL CALC: 54 mg/dL (ref 0–99)
TRIGLYCERIDES: 65 mg/dL (ref 0–149)
VLDL Cholesterol Cal: 13 mg/dL (ref 5–40)

## 2018-03-11 LAB — COMPREHENSIVE METABOLIC PANEL
A/G RATIO: 1.4 (ref 1.2–2.2)
ALBUMIN: 4.8 g/dL (ref 3.5–5.5)
ALT: 21 IU/L (ref 0–44)
AST: 39 IU/L (ref 0–40)
Alkaline Phosphatase: 74 IU/L (ref 39–117)
BUN/Creatinine Ratio: 6 — ABNORMAL LOW (ref 9–20)
BUN: 5 mg/dL — AB (ref 6–24)
Bilirubin Total: 0.6 mg/dL (ref 0.0–1.2)
CHLORIDE: 100 mmol/L (ref 96–106)
CO2: 25 mmol/L (ref 20–29)
Calcium: 10.1 mg/dL (ref 8.7–10.2)
Creatinine, Ser: 0.82 mg/dL (ref 0.76–1.27)
GFR calc Af Amer: 114 mL/min/{1.73_m2} (ref >59)
GFR calc non Af Amer: 99 mL/min/{1.73_m2} (ref >59)
GLOBULIN, TOTAL: 3.4 g/dL (ref 1.5–4.5)
Glucose: 80 mg/dL (ref 65–99)
POTASSIUM: 5.4 mmol/L — AB (ref 3.5–5.2)
Sodium: 140 mmol/L (ref 134–144)
TOTAL PROTEIN: 8.2 g/dL (ref 6.0–8.5)

## 2018-03-12 ENCOUNTER — Ambulatory Visit (AMBULATORY_SURGERY_CENTER): Payer: Self-pay | Admitting: *Deleted

## 2018-03-12 VITALS — Ht 71.0 in | Wt 131.8 lb

## 2018-03-12 DIAGNOSIS — Z1211 Encounter for screening for malignant neoplasm of colon: Secondary | ICD-10-CM

## 2018-03-12 MED ORDER — PLENVU 140 G PO SOLR: 1.0000 | Freq: Once | ORAL | 0 refills | Status: AC

## 2018-03-12 NOTE — Progress Notes (Signed)
No egg or soy allergy known to patient  No issues with past sedation with any surgeries  or procedures, no intubation problems  No diet pills per patient No home 02 use per patient  No blood thinners per patient  Pt denies issues with constipation  No A fib or A flutter  EMMI video offered the patient declined. Plenvu sample given, Lot # [id number], exp 02/2019

## 2018-03-13 ENCOUNTER — Telehealth (INDEPENDENT_AMBULATORY_CARE_PROVIDER_SITE_OTHER): Payer: Self-pay

## 2018-03-13 ENCOUNTER — Encounter: Payer: Self-pay | Admitting: Physical Therapy

## 2018-03-13 ENCOUNTER — Ambulatory Visit: Payer: Self-pay | Attending: Orthopaedic Surgery | Admitting: Physical Therapy

## 2018-03-13 DIAGNOSIS — M25512 Pain in left shoulder: Secondary | ICD-10-CM | POA: Insufficient documentation

## 2018-03-13 DIAGNOSIS — M25612 Stiffness of left shoulder, not elsewhere classified: Secondary | ICD-10-CM | POA: Insufficient documentation

## 2018-03-13 DIAGNOSIS — G8929 Other chronic pain: Secondary | ICD-10-CM | POA: Insufficient documentation

## 2018-03-13 DIAGNOSIS — M6281 Muscle weakness (generalized): Secondary | ICD-10-CM | POA: Insufficient documentation

## 2018-03-13 NOTE — Telephone Encounter (Signed)
-----   Message from Loletta Specteroger David Gomez, PA-C sent at 03/11/2018 12:59 PM EDT ----- Normal results except for mild elevation in potassium. He should redraw potassium level in 2-4 weeks.

## 2018-03-13 NOTE — Therapy (Signed)
[place], [place], [address] Phone: [phone]   Fax:  [phone]  Physical Therapy Treatment  Patient Details  Name: Dan Clark MRN: [id number] Date of Birth: 03-15-1961 Referring Provider: Dr Frankey Shown    Encounter Date: 03/13/2018  PT End of Session - 03/13/18 0938    Visit Number  7    Number of Visits  16    Date for PT Re-Evaluation  04/02/18    Authorization Type  Notes say CAFA; cvg says self pay note sent to front desk to clarify     PT Start Time  0935    PT Stop Time  1015    PT Time Calculation (min)  40 min       Past Medical History:  Diagnosis Date  . Allergy   . Arthritis   . Closed fracture of left proximal humerus 03/28/2017  . GERD (gastroesophageal reflux disease)   . Hypertension     Past Surgical History:  Procedure Laterality Date  . MULTIPLE TOOTH EXTRACTIONS    . ORIF HUMERUS FRACTURE Left 03/28/2017  . ORIF HUMERUS FRACTURE Left 03/28/2017   Procedure: OPEN REDUCTION INTERNAL FIXATION (ORIF) LEFT PROXIMAL HUMERUS FRACTURE;  Surgeon: Marchia Bond, MD;  Location: [facility];  Service: Orthopedics;  Laterality: Left;    There were no vitals filed for this visit.  Subjective Assessment - 03/13/18 0936    Subjective  A little better. Left hand and elbow 4/10     Currently in Pain?  Yes    Pain Score  4     Pain Location  Arm    Pain Orientation  Left    Pain Radiating Towards  left hand          [facility] PT Assessment - 03/13/18 0001      AROM   Left Shoulder Flexion  120 Degrees      PROM   Right Shoulder Flexion  130 Degrees    Right Shoulder ABduction  108 Degrees    Right Shoulder Internal Rotation  60 Degrees    Right Shoulder External Rotation  50 Degrees                   [facility] Adult PT Treatment/Exercise - 03/13/18 0001      Shoulder Exercises: Supine   Protraction  10 reps    Horizontal ABduction  20 reps    Theraband Level  (Shoulder Horizontal ABduction)  Level 2 (Red)    External Rotation  Strengthening;20 reps;Theraband    Theraband Level (Shoulder External Rotation)  Level 2 (Red)    Other Supine Exercises  Supine pressups 2#, pullovers 2# and ER x 10; supine flexion/ extension in low range 1lb 2x10; abduction/ adduction 2x10 1lb     Other Supine Exercises  rhythmic stab, supine red band bicep and tricep strengthening, PTA holding band x10 each       Manual Therapy   Manual therapy comments  genetle PROM into flexion/abduction/ ER/ IR     Joint Mobilization  grade II A/P and inferior glides, scapular mobs                PT Short Term Goals - 03/13/18 1014      PT SHORT TERM GOAL #1   Title  Patient will demsotrate increased left shoulder passive flexion by 15 degrees     Baseline  130    Time  4    Period  Weeks  Status  Achieved      PT SHORT TERM GOAL #2   Title  Patient will inncrease passive left shoulder ER by 10 degrees     Baseline  50    Time  4    Period  Days    Status  Achieved      PT SHORT TERM GOAL #3   Title  Patient will increase left shoulder flexion strength to 4/5 in available range     Baseline  working on strengthening     Time  4    Period  Weeks    Status  On-going        PT Long Term Goals - 02/05/18 1329      PT LONG TERM GOAL #1   Title  Patient will reach behind his head without increased pain and without putting his head forward in order to complete ADL's     Time  8    Period  Weeks    Status  New    Target Date  04/02/18      PT LONG TERM GOAL #2   Title  Patient will reach up to a cabinet without pain in order to perfrom ADL's     Time  8    Period  Weeks    Status  New    Target Date  04/02/18      PT LONG TERM GOAL #3   Title  Patient will sleep through the night without report of his shoulder waking him up     Time  8    Period  Weeks    Status  New    Target Date  04/02/18            Plan - 03/13/18 1015    Clinical  Impression Statement  PROM improved. STG# 1,#2 met. Continued with PROM and supine strengthening, Tolerated well, fatigues.     PT Next Visit Plan  Update HEP ;begin passive ROM and mobilizations to tolerance; consider adding scapualr strengthening; review wand exercises; add pulleys; modalities PRN; add scap retraction; consider putty exercises or towell squeeze for his hand?? hand was pretty swollen on inital eval    PT Home Exercise Plan  pendulums, wand flexion, ER, thumb abduction stretch, towel squeeze , supine horizontal abduction and ER, standing rows     Consulted and Agree with Plan of Care  Patient       Patient will benefit from skilled therapeutic intervention in order to improve the following deficits and impairments:  Pain, Postural dysfunction, Impaired UE functional use, Decreased activity tolerance, Decreased endurance, Decreased range of motion, Decreased strength  Visit Diagnosis: Chronic left shoulder pain  Stiffness of left shoulder, not elsewhere classified  Muscle weakness (generalized)     Problem List Patient Active Problem List   Diagnosis Date Noted  . Chronic left shoulder pain 01/17/2018  . Closed fracture of left proximal humerus 03/28/2017    Dorene Ar, PTA 03/13/2018, 10:19 AM  [facility] [place], [place], [address] Phone: [phone]   Fax:  [phone]  Name: Dan Clark MRN: [id number] Date of Birth: 26-Jul-1961

## 2018-03-13 NOTE — Telephone Encounter (Signed)
Patient was not available. Results provided to his spouse. She will inform patient that results are normal except for mildly elevated potassium and will need to return in 2-4 weeks to redraw potassium. Maryjean Mornempestt S Roberts, CMA

## 2018-03-26 ENCOUNTER — Encounter: Payer: Self-pay | Admitting: Gastroenterology

## 2018-03-26 ENCOUNTER — Ambulatory Visit (AMBULATORY_SURGERY_CENTER): Payer: Self-pay | Admitting: Gastroenterology

## 2018-03-26 VITALS — BP 156/71 | HR 55 | Temp 98.4°F | Resp 11 | Ht 71.0 in | Wt 131.0 lb

## 2018-03-26 DIAGNOSIS — Z1211 Encounter for screening for malignant neoplasm of colon: Secondary | ICD-10-CM

## 2018-03-26 MED ORDER — SODIUM CHLORIDE 0.9 % IV SOLN: 500.0000 mL | Freq: Once | INTRAVENOUS | Status: DC

## 2018-03-26 NOTE — Progress Notes (Signed)
Pt's states no medical or surgical changes since previsit or office visit. 

## 2018-03-26 NOTE — Op Note (Signed)
[facility] Patient Name: Dan Clark Procedure Date: 03/26/2018 9:04 AM MRN: [id number] Endoscopist: Sherilyn CooterHenry L. Myrtie Neitheranis , MD Age: [age] Referring MD:  Date of Birth: 10/24/1960 Gender: Male Account #: [id number] Procedure:                Colonoscopy Indications:              Screening for colorectal malignant neoplasm, This                            is the patient's first colonoscopy Medicines:                Monitored Anesthesia Care Procedure:                Pre-Anesthesia Assessment:                           - Prior to the procedure, a History and Physical                            was performed, and patient medications and                            allergies were reviewed. The patient's tolerance of                            previous anesthesia was also reviewed. The risks                            and benefits of the procedure and the sedation                            options and risks were discussed with the patient.                            All questions were answered, and informed consent                            was obtained. Prior Anticoagulants: The patient has                            taken no previous anticoagulant or antiplatelet                            agents. ASA Grade Assessment: II - A patient with                            mild systemic disease. After reviewing the risks                            and benefits, the patient was deemed in                            satisfactory condition to undergo the procedure.  After obtaining informed consent, the colonoscope                            was passed under direct vision. Throughout the                            procedure, the patient's blood pressure, pulse, and                            oxygen saturations were monitored continuously. The                            Colonoscope was introduced through the anus and                            advanced to the the cecum,  identified by                            appendiceal orifice and ileocecal valve. The                            colonoscopy was performed with difficulty due to a                            tortuous colon. Successful completion of the                            procedure was aided by changing the patient to a                            supine position and using manual pressure. The                            patient tolerated the procedure well. The quality                            of the bowel preparation was excellent. The                            ileocecal valve, appendiceal orifice, and rectum                            were photographed. The quality of the bowel                            preparation was evaluated using the BBPS [facility]                            Bowel Preparation Scale) with scores of: Right                            Colon = 3, Transverse Colon = 3 and Left Colon = 3                            (  entire mucosa seen well with no residual staining,                            small fragments of stool or opaque liquid). The                            total BBPS score equals 9. Scope In: 9:17:04 AM Scope Out: 9:34:49 AM Scope Withdrawal Time: 0 hours 10 minutes 32 seconds  Total Procedure Duration: 0 hours 17 minutes 45 seconds  Findings:                 The perianal and digital rectal examinations were                            normal.                           The sigmoid colon was tortuous.                           The exam was otherwise without abnormality on                            direct and retroflexion views. Complications:            No immediate complications. Estimated Blood Loss:     Estimated blood loss: none. Impression:               - Tortuous colon.                           - The examination was otherwise normal on direct                            and retroflexion views.                           - No specimens collected. Recommendation:            - Patient has a contact number available for                            emergencies. The signs and symptoms of potential                            delayed complications were discussed with the                            patient. Return to normal activities tomorrow.                            Written discharge instructions were provided to the                            patient.                           - Resume previous diet.                           -  Continue present medications.                           - Repeat colonoscopy in 10 years for screening                            purposes. Lizzie An L. Myrtie Neither, MD 03/26/2018 9:37:24 AM This report has been signed electronically.

## 2018-03-26 NOTE — Patient Instructions (Signed)
YOU HAD AN ENDOSCOPIC PROCEDURE TODAY AT THE  ENDOSCOPY CENTER:   Refer to the procedure report that was given to you for any specific questions about what was found during the examination.  If the procedure report does not answer your questions, please call your gastroenterologist to clarify.  If you requested that your care partner not be given the details of your procedure findings, then the procedure report has been included in a sealed envelope for you to review at your convenience later.  YOU SHOULD EXPECT: Some feelings of bloating in the abdomen. Passage of more gas than usual.  Walking can help get rid of the air that was put into your GI tract during the procedure and reduce the bloating. If you had a lower endoscopy (such as a colonoscopy or flexible sigmoidoscopy) you may notice spotting of blood in your stool or on the toilet paper. If you underwent a bowel prep for your procedure, you may not have a normal bowel movement for a few days.  Please Note:  You might notice some irritation and congestion in your nose or some drainage.  This is from the oxygen used during your procedure.  There is no need for concern and it should clear up in a day or so.  SYMPTOMS TO REPORT IMMEDIATELY:   Following lower endoscopy (colonoscopy or flexible sigmoidoscopy):  Excessive amounts of blood in the stool  Significant tenderness or worsening of abdominal pains  Swelling of the abdomen that is new, acute  Fever of 100F or higher   For urgent or emergent issues, a gastroenterologist can be reached at any hour by calling [phone].   DIET:  We do recommend a small meal at first, but then you may proceed to your regular diet.  Drink plenty of fluids but you should avoid alcoholic beverages for 24 hours.  ACTIVITY:  You should plan to take it easy for the rest of today and you should NOT DRIVE or use heavy machinery until tomorrow (because of the sedation medicines used during the test).     FOLLOW UP: Our staff will call the number listed on your records the next business day following your procedure to check on you and address any questions or concerns that you may have regarding the information given to you following your procedure. If we do not reach you, we will leave a message.  However, if you are feeling well and you are not experiencing any problems, there is no need to return our call.  We will assume that you have returned to your regular daily activities without incident.  If any biopsies were taken you will be contacted by phone or by letter within the next 1-3 weeks.  Please call us at [phone] if you have not heard about the biopsies in 3 weeks.    SIGNATURES/CONFIDENTIALITY: You and/or your care partner have signed paperwork which will be entered into your electronic medical record.  These signatures attest to the fact that that the information above on your After Visit Summary has been reviewed and is understood.  Full responsibility of the confidentiality of this discharge information lies with you and/or your care-partner.  Recall 10 years-2029

## 2018-03-26 NOTE — Progress Notes (Signed)
A/ox3, pleased with MAC, report to RN 

## 2018-03-27 ENCOUNTER — Telehealth: Payer: Self-pay

## 2018-03-27 NOTE — Telephone Encounter (Signed)
  Follow up Call-  Call back number 03/26/2018  Post procedure Call Back phone  # [phone]  Permission to leave phone message Yes  Some recent data might be hidden     Patient questions:  Do you have a fever, pain , or abdominal swelling? No. Pain Score  0 *  Have you tolerated food without any problems? Yes.    Have you been able to return to your normal activities? Yes.    Do you have any questions about your discharge instructions: Diet   No. Medications  No. Follow up visit  No.  Do you have questions or concerns about your Care? No.  Actions: * If pain score is 4 or above: No action needed, pain <4.

## 2018-03-28 ENCOUNTER — Encounter: Payer: Self-pay | Admitting: Physical Therapy

## 2018-03-28 ENCOUNTER — Ambulatory Visit: Payer: Self-pay | Admitting: Physical Therapy

## 2018-03-28 DIAGNOSIS — M25512 Pain in left shoulder: Principal | ICD-10-CM

## 2018-03-28 DIAGNOSIS — M6281 Muscle weakness (generalized): Secondary | ICD-10-CM

## 2018-03-28 DIAGNOSIS — G8929 Other chronic pain: Secondary | ICD-10-CM

## 2018-03-28 DIAGNOSIS — M25612 Stiffness of left shoulder, not elsewhere classified: Secondary | ICD-10-CM

## 2018-03-28 NOTE — Therapy (Signed)
[facility] [address] [place], [place], [address] Phone: [phone]   Fax:  [phone]  Physical Therapy Treatment  Patient Details  Name: Dan HumphreyStover L Bayman MRN: [id number] Date of Birth: 10/16/1960 Referring Provider: Dr Gershon MusselNaiping Xu    Encounter Date: 03/28/2018  PT End of Session - 03/28/18 0943    Visit Number  8    Number of Visits  16    Date for PT Re-Evaluation  04/02/18    Authorization Type  Notes say CAFA; cvg says self pay note sent to front desk to clarify     PT Start Time  0935    PT Stop Time  1015    PT Time Calculation (min)  40 min       Past Medical History:  Diagnosis Date  . Allergy   . Arthritis   . Closed fracture of left proximal humerus 03/28/2017  . GERD (gastroesophageal reflux disease)   . Hypertension     Past Surgical History:  Procedure Laterality Date  . MULTIPLE TOOTH EXTRACTIONS    . ORIF HUMERUS FRACTURE Left 03/28/2017  . ORIF HUMERUS FRACTURE Left 03/28/2017   Procedure: OPEN REDUCTION INTERNAL FIXATION (ORIF) LEFT PROXIMAL HUMERUS FRACTURE;  Surgeon: Teryl LucyLandau, Joshua, MD;  Location[facility];  Service: Orthopedics;  Laterality: Left;    There were no vitals filed for this visit.  Subjective Assessment - 03/28/18 0939    Currently in Pain?  Yes    Pain Location  Arm         [facility] PT Assessment - 03/28/18 0001      AROM   Left Shoulder Flexion  100 Degrees    Left Shoulder ABduction  70 Degrees    Left Shoulder Internal Rotation  --   reach to L1   Left Shoulder External Rotation  --   reach to left upper trap     PROM   Right/Left Shoulder  --    Right Shoulder Flexion  --    Right Shoulder ABduction  --    Left Shoulder Flexion  125 Degrees    Left Shoulder ABduction  100 Degrees    Left Shoulder Internal Rotation  55 Degrees    Left Shoulder External Rotation  60 Degrees                   [facility] Adult PT Treatment/Exercise - 03/28/18 0001      Shoulder Exercises: Supine   Horizontal ABduction  20 reps    Theraband Level (Shoulder Horizontal ABduction)  Level 2 (Red)    External Rotation  Strengthening;20 reps;Theraband    Theraband Level (Shoulder External Rotation)  Level 2 (Red)      Shoulder Exercises: Pulleys   Flexion  2 minutes    Scaption  2 minutes      Shoulder Exercises: ROM/Strengthening   Other ROM/Strengthening Exercises  Seated Table Slides for Flexion and Abduction, supine self flexion over pressure       Manual Therapy   Manual therapy comments  genetle PROM into flexion/abduction/ ER/ IR     Joint Mobilization  grade III A/P and inferior glides             PT Education - 03/28/18 1012    Education Details  HEP     Person(s) Educated  Patient    Methods  Explanation;Handout    Comprehension  Verbalized understanding       PT Short Term Goals - 03/13/18 1014  PT SHORT TERM GOAL #1   Title  Patient will demsotrate increased left shoulder passive flexion by 15 degrees     Baseline  130    Time  4    Period  Weeks    Status  Achieved      PT SHORT TERM GOAL #2   Title  Patient will inncrease passive left shoulder ER by 10 degrees     Baseline  50    Time  4    Period  Days    Status  Achieved      PT SHORT TERM GOAL #3   Title  Patient will increase left shoulder flexion strength to 4/5 in available range     Baseline  working on strengthening     Time  4    Period  Weeks    Status  On-going        PT Long Term Goals - 02/05/18 1329      PT LONG TERM GOAL #1   Title  Patient will reach behind his head without increased pain and without putting his head forward in order to complete ADL's     Time  8    Period  Weeks    Status  New    Target Date  04/02/18      PT LONG TERM GOAL #2   Title  Patient will reach up to a cabinet without pain in order to perfrom ADL's     Time  8    Period  Weeks    Status  New    Target Date  04/02/18      PT LONG TERM GOAL #3   Title   Patient will sleep through the night without report of his shoulder waking him up     Time  8    Period  Weeks    Status  New    Target Date  04/02/18            Plan - 03/28/18 1030    Clinical Impression Statement  Pt arrives after 2 weeks absence and reports of non compliance with HEP. ROM decreased since last visit. Instructed pt in table slides to improve flexion and abduction ROM. He reports sleeping more on left and improved reaching. He was able to reach top cabinet shelf with difficulty.     PT Next Visit Plan  Update HEP ;begin passive ROM and mobilizations to tolerance; consider adding scapualr strengthening; review wand exercises; add pulleys; modalities PRN; add scap retraction; consider putty exercises or towell squeeze for his hand?? hand was pretty swollen on inital eval    PT Home Exercise Plan  pendulums, wand flexion, ER, thumb abduction stretch, towel squeeze , supine horizontal abduction and ER, standing rows , table slides abduction and flexion     Consulted and Agree with Plan of Care  Patient       Patient will benefit from skilled therapeutic intervention in order to improve the following deficits and impairments:  Pain, Postural dysfunction, Impaired UE functional use, Decreased activity tolerance, Decreased endurance, Decreased range of motion, Decreased strength  Visit Diagnosis: Chronic left shoulder pain  Stiffness of left shoulder, not elsewhere classified  Muscle weakness (generalized)     Problem List Patient Active Problem List   Diagnosis Date Noted  . Chronic left shoulder pain 01/17/2018  . Closed fracture of left proximal humerus 03/28/2017    Sherrie Mustache, PTA 03/28/2018, 10:32 AM  [facility] [address] [place], [place], [address] Phone: [phone]   Fax:  [phone]  Name: Dan HumphreyStover L Garry MRN: [id number] Date of Birth: 07/18/1961

## 2018-03-31 ENCOUNTER — Ambulatory Visit: Payer: Self-pay | Admitting: Physical Therapy

## 2018-04-02 ENCOUNTER — Ambulatory Visit: Payer: Self-pay | Admitting: Physical Therapy

## 2018-04-02 ENCOUNTER — Encounter: Payer: Self-pay | Admitting: Physical Therapy

## 2018-04-02 DIAGNOSIS — M25512 Pain in left shoulder: Principal | ICD-10-CM

## 2018-04-02 DIAGNOSIS — M25612 Stiffness of left shoulder, not elsewhere classified: Secondary | ICD-10-CM

## 2018-04-02 DIAGNOSIS — G8929 Other chronic pain: Secondary | ICD-10-CM

## 2018-04-02 DIAGNOSIS — M6281 Muscle weakness (generalized): Secondary | ICD-10-CM

## 2018-04-02 NOTE — Therapy (Signed)
[facility] [address] [place], [place], [address] Phone: [phone]   Fax:  [phone]  Physical Therapy Treatment  Patient Details  Name: Dan Clark MRN: [id number] Date of Birth: 07/10/1961 Referring Provider: Dr Gershon MusselNaiping Xu    Encounter Date: 04/02/2018  PT End of Session - 04/02/18 0850    Visit Number  9    Number of Visits  16    Date for PT Re-Evaluation  04/02/18    Authorization Type  CAFA Expires 05/24/2018    PT Start Time  0845    PT Stop Time  0930    PT Time Calculation (min)  45 min       Past Medical History:  Diagnosis Date  . Allergy   . Arthritis   . Closed fracture of left proximal humerus 03/28/2017  . GERD (gastroesophageal reflux disease)   . Hypertension     Past Surgical History:  Procedure Laterality Date  . MULTIPLE TOOTH EXTRACTIONS    . ORIF HUMERUS FRACTURE Left 03/28/2017  . ORIF HUMERUS FRACTURE Left 03/28/2017   Procedure: OPEN REDUCTION INTERNAL FIXATION (ORIF) LEFT PROXIMAL HUMERUS FRACTURE;  Surgeon: Teryl LucyLandau, Joshua, MD;  Location: [facility] OR;  Service: Orthopedics;  Laterality: Left;    There were no vitals filed for this visit.  Subjective Assessment - 04/02/18 0849    Subjective  During the day shoulder is a 4/10, night time pain increases to 7-8/10 pain.     Currently in Pain?  Yes    Pain Score  4     Pain Location  Shoulder    Pain Orientation  Left    Pain Descriptors / Indicators  Aching    Aggravating Factors   sleeping on left, reaching     Pain Relieving Factors  rest                       [facility] Adult PT Treatment/Exercise - 04/02/18 0001      Shoulder Exercises: Supine   Flexion  Strengthening;Weights;10 reps    Shoulder Flexion Weight (lbs)  1, 2    Flexion Limitations  elbow flexed 2# and extended 1#      Other Supine Exercises  bicep curls 2#       Shoulder Exercises: Sidelying   External Rotation  20 reps;Weights    External Rotation  Weight (lbs)  1    ABduction  AROM;20 reps      Shoulder Exercises: Standing   Other Standing Exercises  standing cane       Shoulder Exercises: Pulleys   Flexion  2 minutes    Scaption  2 minutes      Shoulder Exercises: ROM/Strengthening   Other ROM/Strengthening Exercises  Seated Table Slides for Flexion and Abduction, supine self flexion over pressure       Manual Therapy   Manual therapy comments  genetle PROM into flexion/abduction/ ER/ IR     Joint Mobilization  grade III A/P and inferior glides               PT Short Term Goals - 03/13/18 1014      PT SHORT TERM GOAL #1   Title  Patient will demsotrate increased left shoulder passive flexion by 15 degrees     Baseline  130    Time  4    Period  Weeks    Status  Achieved      PT SHORT TERM GOAL #2  Title  Patient will inncrease passive left shoulder ER by 10 degrees     Baseline  50    Time  4    Period  Days    Status  Achieved      PT SHORT TERM GOAL #3   Title  Patient will increase left shoulder flexion strength to 4/5 in available range     Baseline  working on strengthening     Time  4    Period  Weeks    Status  On-going        PT Long Term Goals - 02/05/18 1329      PT LONG TERM GOAL #1   Title  Patient will reach behind his head without increased pain and without putting his head forward in order to complete ADL's     Time  8    Period  Weeks    Status  New    Target Date  04/02/18      PT LONG TERM GOAL #2   Title  Patient will reach up to a cabinet without pain in order to perfrom ADL's     Time  8    Period  Weeks    Status  New    Target Date  04/02/18      PT LONG TERM GOAL #3   Title  Patient will sleep through the night without report of his shoulder waking him up     Time  8    Period  Weeks    Status  New    Target Date  04/02/18            Plan - 04/02/18 0900    Clinical Impression Statement  Pt arrives reporting knee and back pain. Shoulder averages 4/10  during the day and 7/10 at night when he tries to sleep on his left side. He reports doing some of his HEP with bands since last visit. He will see MD for follow up next week. He would like to continue PT for his shoulder. Began strengthening with weights. He tolerated well.     PT Next Visit Plan  ERO, PROM/ROM , scapular and GHJ strength    PT Home Exercise Plan  pendulums, wand flexion, ER, thumb abduction stretch, towel squeeze , supine horizontal abduction and ER, standing rows , table slides abduction and flexion     Consulted and Agree with Plan of Care  Patient       Patient will benefit from skilled therapeutic intervention in order to improve the following deficits and impairments:  Pain, Postural dysfunction, Impaired UE functional use, Decreased activity tolerance, Decreased endurance, Decreased range of motion, Decreased strength  Visit Diagnosis: Chronic left shoulder pain  Stiffness of left shoulder, not elsewhere classified  Muscle weakness (generalized)     Problem List Patient Active Problem List   Diagnosis Date Noted  . Chronic left shoulder pain 01/17/2018  . Closed fracture of left proximal humerus 03/28/2017    Sherrie Mustacheonoho, Jasson Siegmann McGee, PTA 04/02/2018, 9:34 AM  [facility] [address] [place], [place], [address] Phone: [phone]   Fax:  [phone]  Name: Dan Clark MRN: [id number] Date of Birth: 04/12/1961

## 2018-04-07 ENCOUNTER — Encounter (INDEPENDENT_AMBULATORY_CARE_PROVIDER_SITE_OTHER): Payer: Self-pay | Admitting: Physician Assistant

## 2018-04-07 ENCOUNTER — Ambulatory Visit: Payer: Self-pay | Admitting: Physical Therapy

## 2018-04-07 ENCOUNTER — Ambulatory Visit (INDEPENDENT_AMBULATORY_CARE_PROVIDER_SITE_OTHER): Payer: Self-pay | Admitting: Physician Assistant

## 2018-04-07 ENCOUNTER — Other Ambulatory Visit: Payer: Self-pay

## 2018-04-07 VITALS — BP 152/84 | HR 61 | Temp 98.0°F | Ht 71.0 in | Wt 133.2 lb

## 2018-04-07 DIAGNOSIS — M6281 Muscle weakness (generalized): Secondary | ICD-10-CM

## 2018-04-07 DIAGNOSIS — R3911 Hesitancy of micturition: Secondary | ICD-10-CM

## 2018-04-07 DIAGNOSIS — N401 Enlarged prostate with lower urinary tract symptoms: Secondary | ICD-10-CM

## 2018-04-07 DIAGNOSIS — M25612 Stiffness of left shoulder, not elsewhere classified: Secondary | ICD-10-CM

## 2018-04-07 DIAGNOSIS — I10 Essential (primary) hypertension: Secondary | ICD-10-CM

## 2018-04-07 DIAGNOSIS — G8929 Other chronic pain: Secondary | ICD-10-CM

## 2018-04-07 DIAGNOSIS — M25512 Pain in left shoulder: Principal | ICD-10-CM

## 2018-04-07 MED ORDER — TERAZOSIN HCL 5 MG PO CAPS
5.0000 mg | ORAL_CAPSULE | Freq: Every day | ORAL | 5 refills | Status: DC
Start: 2018-04-07 — End: 2021-10-16

## 2018-04-07 MED ORDER — AMLODIPINE BESYLATE 10 MG PO TABS: 10.0000 mg | ORAL_TABLET | Freq: Every day | ORAL | 5 refills | Status: AC

## 2018-04-07 NOTE — Progress Notes (Signed)
Subjective:  Patient ID: Dan Clark, male    DOB: 08/14/1960  Age: 57 y.o. MRN: [id number]  CC: f/u HTN  HPI  Dan HumphreyStover L Councilis a 57 y.o. LHDmalewith a medical history ofclosed nondisplaced fracture of the left humerus s/p ORIF left proximal humerus,GERDand tobacco abusepresents to f/u on HTN. Last BP 153/82 mmHg. Taking amlodipine daily as directed but did not take this morning. Does not measure BP outside of clinic. Denies CP, palpitations, SOB, HA, tingling, numbness, abdominal pain, f/c/n/v, LE edema, presyncope, syncope, rash, or GI sxs.     Only complaint is of urinary hesitancy. Does not endorse dysuria, hematuria, perineal pain, low back pain, unintentional weight loss, or night sweats. Last PSA 0.6 ng/mL 10 months ago.      Outpatient Medications Prior to Visit  Medication Sig Dispense Refill  . amLODipine (NORVASC) 10 MG tablet Take 1 tablet (10 mg total) by mouth daily. 90 tablet 1  . meloxicam (MOBIC) 7.5 MG tablet Take 1 tablet (7.5 mg total) by mouth daily as needed for up to 30 doses for pain. (Patient not taking: Reported on 04/07/2018) 30 tablet 0   No facility-administered medications prior to visit.      ROS Review of Systems  Constitutional: Negative for chills, fever and malaise/fatigue.  Eyes: Negative for blurred vision.  Respiratory: Negative for shortness of breath.   Cardiovascular: Negative for chest pain and palpitations.  Gastrointestinal: Negative for abdominal pain and nausea.  Genitourinary: Negative for dysuria and hematuria.       Urinary hesitancy  Musculoskeletal: Negative for joint pain and myalgias.  Skin: Negative for rash.  Neurological: Negative for tingling and headaches.  Psychiatric/Behavioral: Negative for depression. The patient is not nervous/anxious.     Objective:  BP (!) 152/84 (BP Location: Left Arm, Patient Position: Sitting, Cuff Size: Normal)   Pulse 61   Temp 98 F (36.7 C) (Oral)   Ht 5\' 11"  (1.803 m)    Wt 133 lb 3.2 oz (60.4 kg)   SpO2 99%   BMI 18.58 kg/m   BP/Weight 04/07/2018 03/26/2018 03/12/2018  Systolic BP 152 156 -  Diastolic BP 84 71 -  Wt. (Lbs) 133.2 131 131.8  BMI 18.58 18.27 18.38      Physical Exam  Constitutional: He is oriented to person, place, and time.  Well developed, thin, NAD, polite  HENT:  Head: Normocephalic and atraumatic.  Eyes: No scleral icterus.  Neck: Normal range of motion. Neck supple. No thyromegaly present.  Cardiovascular: Normal rate, regular rhythm and normal heart sounds.  Pulmonary/Chest: Effort normal and breath sounds normal.  Abdominal: Soft. Bowel sounds are normal. There is no tenderness.  Genitourinary:  Genitourinary Comments: Prostate approximately 30 - 35 g. Somewhat firm consistency. Symmetrical with no loss of central fissure. No nodules.   Musculoskeletal: He exhibits no edema.  Neurological: He is alert and oriented to person, place, and time.  Skin: Skin is warm and dry. No rash noted. No erythema. No pallor.  Psychiatric: He has a normal mood and affect. His behavior is normal. Thought content normal.  Vitals reviewed.    Assessment & Plan:    1. Hypertension, unspecified type - Begin terazosin (HYTRIN) 5 MG capsule; Take 1 capsule (5 mg total) by mouth at bedtime.  Dispense: 30 capsule; Refill: 5 - Refill amLODipine (NORVASC) 10 MG tablet; Take 1 tablet (10 mg total) by mouth daily.  Dispense: 30 tablet; Refill: 5  2. Benign prostatic hyperplasia with urinary hesitancy - Begin  terazosin (HYTRIN) 5 MG capsule; Take 1 capsule (5 mg total) by mouth at bedtime.  Dispense: 30 capsule; Refill: 5   Meds ordered this encounter  Medications  . terazosin (HYTRIN) 5 MG capsule    Sig: Take 1 capsule (5 mg total) by mouth at bedtime.    Dispense:  30 capsule    Refill:  5    Order Specific Question:   Supervising Provider    Answer:   Hoy Register [[id number]]  . amLODipine (NORVASC) 10 MG tablet    Sig: Take 1 tablet (10  mg total) by mouth daily.    Dispense:  30 tablet    Refill:  5    Order Specific Question:   Supervising Provider    Answer:   Hoy Register [[id number]]    Follow-up: Return in about 6 weeks (around 05/19/2018) for HTN.   Loletta Specter PA

## 2018-04-07 NOTE — Therapy (Addendum)
[place], [place], [address] Phone: [phone]   Fax:  [phone]  Physical Therapy Treatment/ Re-evaluation/ Discharge   Patient Details  Name: Dan Clark MRN: [id number] Date of Birth: 11/24/1960 Referring Provider: Dr Frankey Shown    Encounter Date: 04/07/2018  PT End of Session - 04/07/18 0913    Visit Number  10    Number of Visits  16    Date for PT Re-Evaluation  05/24/18    Authorization Type  CAFA Expires 05/24/2018, FOTO done on 04/07/18    PT Start Time  0846    PT Stop Time  0930    PT Time Calculation (min)  44 min    Activity Tolerance  Patient tolerated treatment well    Behavior During Therapy  [facility] for tasks assessed/performed       Past Medical History:  Diagnosis Date  . Allergy   . Arthritis   . Closed fracture of left proximal humerus 03/28/2017  . GERD (gastroesophageal reflux disease)   . Hypertension     Past Surgical History:  Procedure Laterality Date  . MULTIPLE TOOTH EXTRACTIONS    . ORIF HUMERUS FRACTURE Left 03/28/2017  . ORIF HUMERUS FRACTURE Left 03/28/2017   Procedure: OPEN REDUCTION INTERNAL FIXATION (ORIF) LEFT PROXIMAL HUMERUS FRACTURE;  Surgeon: Marchia Bond, MD;  Location: [place];  Service: Orthopedics;  Laterality: Left;    There were no vitals filed for this visit.  Subjective Assessment - 04/07/18 0924    Subjective  Pt arriving to therpay reporting 5/10 pain in his left shoulder and 8/10 in his knees.     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  Yes    Pain Score  5     Pain Location  Shoulder    Pain Orientation  Left    Pain Descriptors / Indicators  Aching;Tightness;Sore    Pain Type  Chronic pain    Pain Radiating Towards  left hand at times but is less than it use to be    Pain Onset   More than a month ago    Pain Frequency  Constant    Aggravating Factors   sleeping on left side    Pain Relieving Factors  resting    Effect of Pain on Daily Activities  difficulty reaching and holding items, unable to work as a Curator    Pain Score  8    Pain Location  Knee    Pain Orientation  --   both   Pain Descriptors / Indicators  Aching;Discomfort    Pain Type  Chronic pain    Pain Onset  More than a month ago    Pain Frequency  Constant    Aggravating Factors   walking, stiting long periods         [facility] PT Assessment - 04/07/18 0001      AROM   AROM Assessment Site  Shoulder    Right/Left Shoulder  Left    Left Shoulder Flexion  105 Degrees    Left Shoulder ABduction  70 Degrees      PROM   Left Shoulder Flexion  120 Degrees    Left Shoulder ABduction  105 Degrees    Left Shoulder Internal Rotation  55 Degrees    Left Shoulder External  Rotation  60 Degrees      Strength   Left Shoulder Flexion  4-/5    Left Shoulder ABduction  3+/5    Left Shoulder Internal Rotation  4/5    Left Shoulder External Rotation  4/5    Right Hand Grip (lbs)  90 lbs    Left Hand Grip (lbs)  40 lbs                   [facility] Adult PT Treatment/Exercise - 04/07/18 0001      Shoulder Exercises: Standing   Row  20 reps    Other Standing Exercises  red theraband    Other Standing Exercises  standing flexion with cane x 20 reps, ball wall walking x 20 reps       Shoulder Exercises: Pulleys   Flexion  2 minutes    Scaption  2 minutes      Manual Therapy   Manual therapy comments  genetle PROM into flexion/abduction/ ER/ IR     Joint Mobilization  grade III A/P and inferior glides             PT Education - 04/07/18 0912    Education Details  reviewed HEP    Person(s) Educated  Patient    Methods  Explanation    Comprehension  Verbalized understanding       PT Short Term Goals - 03/13/18 1014      PT SHORT TERM GOAL #1   Title  Patient will demsotrate  increased left shoulder passive flexion by 15 degrees     Baseline  130    Time  4    Period  Weeks    Status  Achieved      PT SHORT TERM GOAL #2   Title  Patient will inncrease passive left shoulder ER by 10 degrees     Baseline  50    Time  4    Period  Days    Status  Achieved      PT SHORT TERM GOAL #3   Title  Patient will increase left shoulder flexion strength to 4/5 in available range     Baseline  working on strengthening     Time  4    Period  Weeks    Status  On-going        PT Long Term Goals - 04/07/18 6195      PT LONG TERM GOAL #1   Title  Patient will reach behind his head without increased pain and without putting his head forward in order to complete ADL's     Time  8    Period  Weeks    Status  Achieved    Target Date  05/24/18      PT LONG TERM GOAL #2   Title  Patient will reach up to a cabinet without pain in order to perfrom ADL's     Baseline  Pt still reporting pain when reaching overhead for an object.     Period  Weeks    Status  On-going    Target Date  05/24/18      PT LONG TERM GOAL #3   Title  Patient will sleep through the night without report of his shoulder waking him up     Baseline  Pt reporting his shoulder is still waking him up almost every night.     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     Time  8    Period  Weeks    Status  New    Target Date  05/23/18            Plan - 04/07/18 0914    Clinical Impression Statement  Pt arriving reporting mild knee and back pain, Pt reporting left shoulder pain of 5/10. Pt able to verbally state his Home exercises and reporting doing them at least once daily. Pt was encouraged to perform his exercises 2-3 times daily. Pt has improved his overall ROM but still is lacking in left shoulder  flexion/ abd/ ER.     Rehab Potential  Good     Clinical Impairments Affecting Rehab Potential  long standing motion limitations     PT Frequency  2x / week    PT Duration  8 weeks    PT Treatment/Interventions  ADLs/Self Care Home Management;Cryotherapy;Electrical Stimulation;Iontophoresis 23m/ml Dexamethasone;Ultrasound;Traction;Functional mobility training;Therapeutic activities;Therapeutic exercise;Patient/family education;Manual techniques;Passive range of motion;Dry needling;Taping    PT Next Visit Plan  ERO, PROM/ROM , scapular and GHJ strength    PT Home Exercise Plan  pendulums, wand flexion, ER, thumb abduction stretch, towel squeeze , supine horizontal abduction and ER, standing rows , table slides abduction and flexion     Consulted and Agree with Plan of Care  Patient       Patient will benefit from skilled therapeutic intervention in order to improve the following deficits and impairments:  Pain, Postural dysfunction, Impaired UE functional use, Decreased activity tolerance, Decreased endurance, Decreased range of motion, Decreased strength  Visit Diagnosis: Chronic left shoulder pain  Stiffness of left shoulder, not elsewhere classified  Muscle weakness (generalized)    PHYSICAL THERAPY DISCHARGE SUMMARY  Visits from Start of Care: 10  Current functional level related to goals / functional outcomes: Patient has not returned since the last visit    Remaining deficits: Unknown    Education / Equipment: HEP   Plan: Patient agrees to discharge.  Patient goals were not met. Patient is being discharged due to meeting the stated rehab goals.  ?????      Problem List Patient Active Problem List   Diagnosis Date Noted  . Chronic left shoulder pain 01/17/2018  . Closed fracture of left proximal humerus 03/28/2017   DCarolyne LittlesPT DPT  04/07/2018    JOretha Caprice MPT 04/07/2018, 9:29 AM  [facility] [address] [address]Phone:  [phone]  Fax:  [phone] Name: Dan Clark MRN: [id number] of Birth: 108/07/1961

## 2018-04-07 NOTE — Patient Instructions (Signed)
Benign Prostatic Hyperplasia  Benign prostatic hyperplasia (BPH) is an enlarged prostate gland that is caused by the normal aging process and not by cancer. The prostate is a walnut-sized gland that is involved in the production of semen. It is located in front of the rectum and below the bladder. The bladder stores urine and the urethra is the tube that carries the urine out of the body. The prostate may get bigger as a man gets older.  An enlarged prostate can press on the urethra. This can make it harder to pass urine. The build-up of urine in the bladder can cause infection. Back pressure and infection may progress to bladder damage and kidney (renal) failure.  What are the causes?  This condition is part of a normal aging process. However, not all men develop problems from this condition. If the prostate enlarges away from the urethra, urine flow will not be blocked. If it enlarges toward the urethra and compresses it, there will be problems passing urine.  What increases the risk?  This condition is more likely to develop in men over the age of 50 years.  What are the signs or symptoms?  Symptoms of this condition include:  · Getting up often during the night to urinate.  · Needing to urinate frequently during the day.  · Difficulty starting urine flow.  · Decrease in size and strength of your urine stream.  · Leaking (dribbling) after urinating.  · Inability to pass urine. This needs immediate treatment.  · Inability to completely empty your bladder.  · Pain when you pass urine. This is more common if there is also an infection.  · Urinary tract infection (UTI).    How is this diagnosed?  This condition is diagnosed based on your medical history, a physical exam, and your symptoms. Tests will also be done, such as:  · A post-void bladder scan. This measures any amount of urine that may remain in your bladder after you finish urinating.  · A digital rectal exam. In a rectal exam, your health care provider  checks your prostate by putting a lubricated, gloved finger into your rectum to feel the back of your prostate gland. This exam detects the size of your gland and any abnormal lumps or growths.  · An exam of your urine (urinalysis).  · A prostate specific antigen (PSA) screening. This is a blood test used to screen for prostate cancer.  · An ultrasound. This test uses sound waves to electronically produce a picture of your prostate gland.    Your health care provider may refer you to a specialist in kidney and prostate diseases (urologist).  How is this treated?  Once symptoms begin, your health care provider will monitor your condition (active surveillance or watchful waiting). Treatment for this condition will depend on the severity of your condition. Treatment may include:  · Observation and yearly exams. This may be the only treatment needed if your condition and symptoms are mild.  · Medicines to relieve your symptoms, including:  ? Medicines to shrink the prostate.  ? Medicines to relax the muscle of the prostate.  · Surgery in severe cases. Surgery may include:  ? Prostatectomy. In this procedure, the prostate tissue is removed completely through an open incision or with a laparascope or robotics.  ? Transurethral resection of the prostate (TURP). In this procedure, a tool is inserted through the opening at the tip of the penis (urethra). It is used to cut away tissue of   the inner core of the prostate. The pieces are removed through the same opening of the penis. This removes the blockage.  ? Transurethral incision (TUIP). In this procedure, small cuts are made in the prostate. This lessens the prostate's pressure on the urethra.  ? Transurethral microwave thermotherapy (TUMT). This procedure uses microwaves to create heat. The heat destroys and removes a small amount of prostate tissue.  ? Transurethral needle ablation (TUNA). This procedure uses radio frequencies to destroy and remove a small amount of  prostate tissue.  ? Interstitial laser coagulation (ILC). This procedure uses a laser to destroy and remove a small amount of prostate tissue.  ? Transurethral electrovaporization (TUVP). This procedure uses electrodes to destroy and remove a small amount of prostate tissue.  ? Prostatic urethral lift. This procedure inserts an implant to push the lobes of the prostate away from the urethra.    Follow these instructions at home:  · Take over-the-counter and prescription medicines only as told by your health care provider.  · Monitor your symptoms for any changes. Contact your health care provider with any changes.  · Avoid drinking large amounts of liquid before going to bed or out in public.  · Avoid or reduce how much caffeine or alcohol you drink.  · Give yourself time when you urinate.  · Keep all follow-up visits as told by your health care provider. This is important.  Contact a health care provider if:  · You have unexplained back pain.  · Your symptoms do not get better with treatment.  · You develop side effects from the medicine you are taking.  · Your urine becomes very dark or has a bad smell.  · Your lower abdomen becomes distended and you have trouble passing your urine.  Get help right away if:  · You have a fever or chills.  · You suddenly cannot urinate.  · You feel lightheaded, or very dizzy, or you faint.  · There are large amounts of blood or clots in the urine.  · Your urinary problems become hard to manage.  · You develop moderate to severe low back or flank pain. The flank is the side of your body between the ribs and the hip.  These symptoms may represent a serious problem that is an emergency. Do not wait to see if the symptoms will go away. Get medical help right away. Call your local emergency services (911 in the [place] Do not drive yourself to the hospital.  Summary  · Benign prostatic hyperplasia (BPH) is an enlarged prostate that is caused by the normal aging process and not by  cancer.  · An enlarged prostate can press on the urethra. This can make it hard to pass urine.  · This condition is part of a normal aging process and is more likely to develop in men over the age of 50 years.  · Get help right away if you suddenly cannot urinate.  This information is not intended to replace advice given to you by your health care provider. Make sure you discuss any questions you have with your health care provider.  Document Released: 07/30/2005 Document Revised: 09/03/2016 Document Reviewed: 09/03/2016  Elsevier Interactive Patient Education © 2018 [employer]

## 2018-04-09 ENCOUNTER — Ambulatory Visit: Payer: Self-pay | Admitting: Physical Therapy

## 2018-04-15 ENCOUNTER — Ambulatory Visit: Payer: Self-pay | Admitting: Physical Therapy

## 2018-04-17 ENCOUNTER — Ambulatory Visit: Payer: Self-pay | Admitting: Physical Therapy

## 2018-04-18 ENCOUNTER — Ambulatory Visit (INDEPENDENT_AMBULATORY_CARE_PROVIDER_SITE_OTHER): Payer: Self-pay | Admitting: Orthopaedic Surgery

## 2018-05-06 MED FILL — TERAZOSIN 5 MG CAPSULE: 5 | 30 days supply | Qty: 30 | Fill #0

## 2018-05-06 MED FILL — AMLODIPINE BESYLATE 10 MG T: 10 | 30 days supply | Qty: 30 | Fill #0

## 2021-09-21 ENCOUNTER — Encounter: Payer: Self-pay | Admitting: Gastroenterology

## 2021-10-16 ENCOUNTER — Other Ambulatory Visit: Payer: Self-pay

## 2021-10-16 ENCOUNTER — Ambulatory Visit (INDEPENDENT_AMBULATORY_CARE_PROVIDER_SITE_OTHER)
Admission: RE | Admit: 2021-10-16 | Discharge: 2021-10-16 | Disposition: A | Discharge status: Home / Self Care | Payer: Medicaid Other | Source: Ambulatory Visit | Attending: Gastroenterology | Admitting: Gastroenterology

## 2021-10-16 ENCOUNTER — Other Ambulatory Visit (INDEPENDENT_AMBULATORY_CARE_PROVIDER_SITE_OTHER): Payer: Medicaid Other

## 2021-10-16 ENCOUNTER — Encounter: Payer: Self-pay | Admitting: Gastroenterology

## 2021-10-16 ENCOUNTER — Ambulatory Visit (INDEPENDENT_AMBULATORY_CARE_PROVIDER_SITE_OTHER): Payer: Medicaid Other | Admitting: Gastroenterology

## 2021-10-16 VITALS — BP 124/70 | HR 83 | Ht 71.0 in | Wt 126.0 lb

## 2021-10-16 DIAGNOSIS — R1319 Other dysphagia: Secondary | ICD-10-CM | POA: Diagnosis not present

## 2021-10-16 DIAGNOSIS — R12 Heartburn: Secondary | ICD-10-CM | POA: Diagnosis not present

## 2021-10-16 DIAGNOSIS — R6881 Early satiety: Secondary | ICD-10-CM | POA: Diagnosis not present

## 2021-10-16 DIAGNOSIS — R634 Abnormal weight loss: Secondary | ICD-10-CM

## 2021-10-16 LAB — CBC WITH DIFFERENTIAL/PLATELET
Basophils Absolute: 0.1 10*3/uL (ref 0.0–0.1)
Basophils Relative: 1 % (ref 0.0–3.0)
Eosinophils Absolute: 0.1 10*3/uL (ref 0.0–0.7)
Eosinophils Relative: 1.5 % (ref 0.0–5.0)
HCT: 40.3 % (ref 39.0–52.0)
Hemoglobin: 13.8 g/dL (ref 13.0–17.0)
Lymphocytes Relative: 25.4 % (ref 12.0–46.0)
Lymphs Abs: 1.3 10*3/uL (ref 0.7–4.0)
MCHC: 34.2 g/dL (ref 30.0–36.0)
MCV: 97.1 fl (ref 78.0–100.0)
Monocytes Absolute: 0.6 10*3/uL (ref 0.1–1.0)
Monocytes Relative: 11.7 % (ref 3.0–12.0)
Neutro Abs: 3.2 10*3/uL (ref 1.4–7.7)
Neutrophils Relative %: 60.4 % (ref 43.0–77.0)
Platelets: 196 10*3/uL (ref 150.0–400.0)
RBC: 4.14 Mil/uL — ABNORMAL LOW (ref 4.22–5.81)
RDW: 13.9 % (ref 11.5–15.5)
WBC: 5.3 10*3/uL (ref 4.0–10.5)

## 2021-10-16 LAB — COMPREHENSIVE METABOLIC PANEL
ALT: 17 U/L (ref 0–53)
AST: 40 U/L — ABNORMAL HIGH (ref 0–37)
Albumin: 4.3 g/dL (ref 3.5–5.2)
Alkaline Phosphatase: 78 U/L (ref 39–117)
BUN: 6 mg/dL (ref 6–23)
CO2: 29 mEq/L (ref 19–32)
Calcium: 9.4 mg/dL (ref 8.4–10.5)
Chloride: 99 mEq/L (ref 96–112)
Creatinine, Ser: 0.79 mg/dL (ref 0.40–1.50)
GFR: 96.62 mL/min (ref >60.00)
Glucose, Bld: 107 mg/dL — ABNORMAL HIGH (ref 70–99)
Potassium: 4.2 mEq/L (ref 3.5–5.1)
Sodium: 137 mEq/L (ref 135–145)
Total Bilirubin: 0.9 mg/dL (ref 0.2–1.2)
Total Protein: 7.7 g/dL (ref 6.0–8.3)

## 2021-10-16 LAB — H. PYLORI ANTIBODY, IGG: H Pylori IgG: POSITIVE — AB

## 2021-10-16 NOTE — Patient Instructions (Signed)
If you are age 62 or older, your body mass index should be between 23-30. Your Body mass index is 17.57 kg/m?Marland Kitchen If this is out of the aforementioned range listed, please consider follow up with your Primary Care Provider. ? ?If you are age 68 or younger, your body mass index should be between 19-25. Your Body mass index is 17.57 kg/m?Marland Kitchen If this is out of the aformentioned range listed, please consider follow up with your Primary Care Provider.  ? ?________________________________________________________ ? ?The [facility] providers would like to encourage you to use [facility] to communicate with providers for non-urgent requests or questions.  Due to long hold times on the telephone, sending your provider a message by [facility] may be a faster and more efficient way to get a response.  Please allow 48 business hours for a response.  Please remember that this is for non-urgent requests.  ?_______________________________________________________ ? ?You have been scheduled for an endoscopy. Please follow written instructions given to you at your visit today. ?If you use inhalers (even only as needed), please bring them with you on the day of your procedure. ? ?Your provider has requested that you go to the basement level for lab work before leaving today. Press "B" on the elevator. The lab is located at the first door on the left as you exit the elevator. ? ?Your provider has requested you go to the basement for a chest xray before leaving today. Press "B" on the elevator. ? ?It was a pleasure to see you today! ? ?Thank you for trusting me with your gastrointestinal care!   ? ? ? ?

## 2021-10-16 NOTE — Progress Notes (Signed)
? ? ?[facility] Consult Note: ? ?History: ?Dan Clark ?10/16/2021 ? ?Referring provider: Loletta Specter, PA-C ? ?Reason for consult/chief complaint: Gastroesophageal Reflux (Patient states that when he swallows his food, he will regurgitate.  Some burning. ) ? ? ?Subjective  ?HPI: ? ?This is a 61 year old man referred by primary care for upper digestive symptoms.  The referral indicates weight loss, and unfortunately we do not presently have any recent office notes lab tests or other records from primary care.  Zuriel also has limited health literacy.  He appears to have been referred to the [facility] practice for similar symptoms last May as well as alcohol use and abnormal LFTs, but he says he could not go to the clinic because he did not have a ride. ? ?For perhaps a year he has had chronic feelings of regurgitation, solid food dysphagia, nausea, early satiety and unspecified weight loss.  He started omeprazole sometime in the last couple of months and it has improved symptoms somewhat. ?He is a longtime smoker and says his alcohol use has "cut down".  He typically has 2 beers per day, in the past it was heavier liquor use as well. ? ?Screening colonoscopy with me August 2019 -tortuous colon, otherwise normal without polyps, 10-year recall recommended. ?ROS: ? ?Review of Systems  ?Constitutional:  Negative for appetite change and unexpected weight change.  ?HENT:  Negative for mouth sores and voice change.   ?Eyes:  Negative for pain and redness.  ?Respiratory:  Positive for cough. Negative for shortness of breath.   ?     Denies hemoptysis  ?Cardiovascular:  Negative for chest pain and palpitations.  ?Genitourinary:  Negative for dysuria and hematuria.  ?Musculoskeletal:  Negative for arthralgias and myalgias.  ?Skin:  Negative for pallor and rash.  ?Neurological:  Negative for weakness and headaches.  ?Hematological:  Negative for adenopathy.  ? ? ?Past Medical History: ?Past  Medical History:  ?Diagnosis Date  ? Alcohol abuse   ? Allergy   ? Arthritis   ? Closed fracture of left proximal humerus 03/28/2017  ? GERD (gastroesophageal reflux disease)   ? Hypertension   ? ? ? ?Past Surgical History: ?Past Surgical History:  ?Procedure Laterality Date  ? COLONOSCOPY    ? MULTIPLE TOOTH EXTRACTIONS    ? ORIF HUMERUS FRACTURE Left 03/28/2017  ? ORIF HUMERUS FRACTURE Left 03/28/2017  ? Procedure: OPEN REDUCTION INTERNAL FIXATION (ORIF) LEFT PROXIMAL HUMERUS FRACTURE;  Surgeon: Teryl Lucy, MD;  Location: [facility] OR;  Service: Orthopedics;  Laterality: Left;  ? ? ? ?Family History: ?Family History  ?Problem Relation Age of Onset  ? Breast cancer Mother   ? Diabetes Sister   ? Stomach cancer Neg Hx   ? Rectal cancer Neg Hx   ? Pancreatic cancer Neg Hx   ? Esophageal cancer Neg Hx   ? Colon polyps Neg Hx   ? Colon cancer Neg Hx   ? ? ?Social History: ?Social History  ? ?Socioeconomic History  ? Marital status: Widowed  ?  Spouse name: Not on file  ? Number of children: Not on file  ? Years of education: Not on file  ? Highest education level: Not on file  ?Occupational History  ? Not on file  ?Tobacco Use  ? Smoking status: Every Day  ?  Types: Cigars  ? Smokeless tobacco: Never  ?Vaping Use  ? Vaping Use: Some days  ?Substance and Sexual Activity  ? Alcohol use: Yes  ?  Alcohol/week: 7.0 standard drinks  ?  Types: 7 Cans of beer per week  ?  Comment: 1 to 2 beers daily  ? Drug use: Yes  ?  Types: Marijuana  ?  Comment: past cocaine & current marijuana use  ? Sexual activity: Yes  ?Other Topics Concern  ? Not on file  ?Social History Narrative  ? Not on file  ? ?Social Determinants of Health  ? ?Financial Resource Strain: Not on file  ?Food Insecurity: Not on file  ?Transportation Needs: Not on file  ?Physical Activity: Not on file  ?Stress: Not on file  ?Social Connections: Not on file  ? ? ?Allergies: ?No Known Allergies ? ? ?Outpatient Meds: ?Current Outpatient Medications  ?Medication Sig  Dispense Refill  ? amLODipine (NORVASC) 10 MG tablet Take 1 tablet (10 mg total) by mouth daily. 30 tablet 5  ? omeprazole (PRILOSEC) 20 MG capsule Take 20 mg by mouth daily.    ? tamsulosin (FLOMAX) 0.4 MG CAPS capsule Take 0.4 mg by mouth daily.    ? thiamine 100 MG tablet Take 100 mg by mouth daily.    ? ?No current facility-administered medications for this visit.  ? ? ? ? ?___________________________________________________________________ ?Objective  ? ?Exam: ? ?BP 124/70   Pulse 83   Ht 5\' 11"  (1.803 m)   Wt 126 lb (57.2 kg)   BMI 17.57 kg/m?  ?Wt Readings from Last 3 Encounters:  ?10/16/21 126 lb (57.2 kg)  ?04/07/18 133 lb 3.2 oz (60.4 kg)  ?03/26/18 131 lb (59.4 kg)  ? ? ?General: Chronically ill-appearing man, poor muscle mass ?Eyes: sclera anicteric, no redness ?ENT: oral mucosa moist without lesions, no cervical or supraclavicular lymphadenopathy.  Edentulous (not currently wearing his dentures) ?CV: RRR without murmur, S1/S2, no JVD, no peripheral edema ?Resp: Breath sounds diminished on the left compared to right, no wheezing. ?GI: soft, no tenderness, with active bowel sounds. No guarding or palpable organomegaly noted.  No bulging flanks distention or caput medusa. ?Skin; warm and dry, no rash or jaundice noted.  No spider nevi ?Neuro: awake, alert and oriented x 3. Normal gross motor function and fluent speech ? ?Labs: ? ?No recent data for review.  Patient believes he had some at a PCP visit about a month ago. ? ?Radiologic Studies: ? ?She was in the lung nodule seen on October 2018 CT scan chest ? ?Assessment: ?Encounter Diagnoses  ?Name Primary?  ? Heartburn Yes  ? Esophageal dysphagia   ? Early satiety   ? Weight loss   ? ? ?Chronic upper digestive symptoms, possibly reflux, H. pylori, gastric ulcer, malignancy, achalasia.  Some underlying cause, alcohol and tobacco abuse are likely exacerbating it. ? ?Plan: ?Antireflux diet and lifestyle measures, particularly alcohol and  smoking. ? ?Continue once daily PPI use ? ?EGD.  He was agreeable after discussion of procedure and risks. ? The benefits and risks of the planned procedure were described in detail with the patient or (when appropriate) their health care proxy.  Risks were outlined as including, but not limited to, bleeding, infection, perforation, adverse medication reaction leading to cardiac or pulmonary decompensation, pancreatitis (if ERCP).  The limitation of incomplete mucosal visualization was also discussed.  No guarantees or warranties were given. ? ? ?CBC CMP and H. pylori IgG antibody ? ?PA lateral chest x-ray to rule out effusion or obvious mass. ? ?Thank you for the courtesy of this consult.  Please call me with any questions or concerns. ? ?November 2018 III ? ?  CC: Referring provider noted above ? ?

## 2021-10-17 ENCOUNTER — Other Ambulatory Visit: Payer: Self-pay

## 2021-10-20 MED ORDER — METRONIDAZOLE 500 MG PO TABS: 500.0000 mg | ORAL_TABLET | Freq: Three times a day (TID) | ORAL | 0 refills | Status: AC

## 2021-10-20 MED ORDER — BISMUTH SUBSALICYLATE 262 MG PO TABS: 524.0000 mg | ORAL_TABLET | Freq: Four times a day (QID) | ORAL | 0 refills | Status: AC

## 2021-10-20 MED ORDER — OMEPRAZOLE 20 MG PO CPDR: 20.0000 mg | DELAYED_RELEASE_CAPSULE | Freq: Two times a day (BID) | ORAL | 0 refills | Status: DC

## 2021-10-20 MED ORDER — DOXYCYCLINE HYCLATE 100 MG PO CAPS: 100.0000 mg | ORAL_CAPSULE | Freq: Two times a day (BID) | ORAL | 0 refills | Status: AC

## 2021-11-23 ENCOUNTER — Ambulatory Visit (AMBULATORY_SURGERY_CENTER): Payer: Medicaid Other | Admitting: Gastroenterology

## 2021-11-23 ENCOUNTER — Encounter: Payer: Self-pay | Admitting: Gastroenterology

## 2021-11-23 VITALS — BP 114/76 | HR 69 | Temp 98.3°F | Resp 18 | Ht 71.0 in | Wt 126.0 lb

## 2021-11-23 DIAGNOSIS — K2950 Unspecified chronic gastritis without bleeding: Secondary | ICD-10-CM

## 2021-11-23 DIAGNOSIS — K31A0 Gastric intestinal metaplasia, unspecified: Secondary | ICD-10-CM | POA: Diagnosis not present

## 2021-11-23 DIAGNOSIS — K2970 Gastritis, unspecified, without bleeding: Secondary | ICD-10-CM

## 2021-11-23 DIAGNOSIS — K222 Esophageal obstruction: Secondary | ICD-10-CM | POA: Diagnosis not present

## 2021-11-23 DIAGNOSIS — K219 Gastro-esophageal reflux disease without esophagitis: Secondary | ICD-10-CM

## 2021-11-23 DIAGNOSIS — R1319 Other dysphagia: Secondary | ICD-10-CM | POA: Diagnosis not present

## 2021-11-23 DIAGNOSIS — R12 Heartburn: Secondary | ICD-10-CM

## 2021-11-23 MED ORDER — SODIUM CHLORIDE 0.9 % IV SOLN: 500.0000 mL | Freq: Once | INTRAVENOUS | Status: DC

## 2021-11-23 NOTE — Progress Notes (Signed)
Pt non-responsive, VVS, Report to RN  °

## 2021-11-23 NOTE — Progress Notes (Signed)
History and Physical: ? This patient presents for endoscopic testing for: ?Encounter Diagnoses  ?Name Primary?  ? Heartburn Yes  ? Esophageal dysphagia   ? ? ?Clinical details in 10/16/21 consult note. ?He then had positive H pylori IgG antibody and was treated with bismuth-based quadruple therapy. ? ?ROS: ?Patient denies chest pain or shortness of breath ? ? ?Past Medical History: ?Past Medical History:  ?Diagnosis Date  ? Alcohol abuse   ? Allergy   ? Arthritis   ? Closed fracture of left proximal humerus 03/28/2017  ? GERD (gastroesophageal reflux disease)   ? Hypertension   ? ? ? ?Past Surgical History: ?Past Surgical History:  ?Procedure Laterality Date  ? COLONOSCOPY    ? MULTIPLE TOOTH EXTRACTIONS    ? ORIF HUMERUS FRACTURE Left 03/28/2017  ? ORIF HUMERUS FRACTURE Left 03/28/2017  ? Procedure: OPEN REDUCTION INTERNAL FIXATION (ORIF) LEFT PROXIMAL HUMERUS FRACTURE;  Surgeon: Teryl Lucy, MD;  Location: [facility] OR;  Service: Orthopedics;  Laterality: Left;  ? ? ?Allergies: ?No Known Allergies ? ?Outpatient Meds: ?Current Outpatient Medications  ?Medication Sig Dispense Refill  ? amLODipine (NORVASC) 10 MG tablet Take 1 tablet (10 mg total) by mouth daily. 30 tablet 5  ? omeprazole (PRILOSEC) 20 MG capsule Take 20 mg by mouth daily.    ? tamsulosin (FLOMAX) 0.4 MG CAPS capsule Take 0.4 mg by mouth daily.    ? thiamine 100 MG tablet Take 100 mg by mouth daily.    ? omeprazole (PRILOSEC) 20 MG capsule Take 1 capsule (20 mg total) by mouth 2 (two) times daily for 14 days. 28 capsule 0  ? ?Current Facility-Administered Medications  ?Medication Dose Route Frequency Provider Last Rate Last Admin  ? 0.9 %  sodium chloride infusion  500 mL Intravenous Once Sherrilyn Rist, MD      ? ? ? ? ?___________________________________________________________________ ?Objective  ? ?Exam: ? ?BP 138/87   Pulse 80   Temp 98.3 ?F (36.8 ?C) (Temporal)   Ht 5\' 11"  (1.803 m)   Wt 126 lb (57.2 kg)   SpO2 100%   BMI 17.57 kg/m?   ? ?CV: RRR without murmur, S1/S2 ?Resp: clear to auscultation bilaterally, normal RR and effort noted ?GI: soft, no tenderness, with active bowel sounds. ? ? ?Assessment: ?Encounter Diagnoses  ?Name Primary?  ? Heartburn Yes  ? Esophageal dysphagia   ? ? ? ?Plan: ?EGD ? ? ?The patient is appropriate for an endoscopic procedure in the ambulatory setting. ? ? - , MD ? ? ? ? ?

## 2021-11-23 NOTE — Op Note (Signed)
[facility] ?Patient Name: Dan Clark ?Procedure Date: 11/23/2021 9:53 AM ?MRN: [id number] ?Endoscopist: Starr Lake. Myrtie Neither , MD ?Age: 61 ?Referring MD:  ?Date of Birth: March 24, 1961 ?Gender: Male ?Account #: [id number] ?Procedure:                Upper GI endoscopy ?Indications:              Esophageal dysphagia, Esophageal reflux symptoms  ?                          that persist despite appropriate therapy,  ?                          Previously treated for Helicobacter pylori (recent  ?                          bismuth-based quadruple therapy) ?Medicines:                Monitored Anesthesia Care ?Procedure:                Pre-Anesthesia Assessment: ?                          - Prior to the procedure, a History and Physical  ?                          was performed, and patient medications and  ?                          allergies were reviewed. The patient's tolerance of  ?                          previous anesthesia was also reviewed. The risks  ?                          and benefits of the procedure and the sedation  ?                          options and risks were discussed with the patient.  ?                          All questions were answered, and informed consent  ?                          was obtained. Prior Anticoagulants: The patient has  ?                          taken no previous anticoagulant or antiplatelet  ?                          agents. ASA Grade Assessment: II - A patient with  ?                          mild systemic disease. After reviewing the risks  ?  and benefits, the patient was deemed in  ?                          satisfactory condition to undergo the procedure. ?                          After obtaining informed consent, the endoscope was  ?                          passed under direct vision. Throughout the  ?                          procedure, the patient's blood pressure, pulse, and  ?                          oxygen saturations were monitored  continuously. The  ?                          GIF [id number] #[id number] was introduced through the  ?                          mouth, and advanced to the second part of duodenum.  ?                          The upper GI endoscopy was accomplished without  ?                          difficulty. The patient tolerated the procedure  ?                          well. ?Scope In: ?Scope Out: ?Findings:                 A mild semi-circumferential Schatzki ring was found  ?                          in the distal esophagus. The scope was withdrawn  ?                          after complete EGD. Dilation was performed with a  ?                          Maloney dilator with no resistance at 52 Fr. ?                          The exam of the esophagus was otherwise normal. ?                          Diffuse mild mucosal changes characterized by  ?                          edema/cobblestoning were found in the gastric body.  ?                          Several  biopsies were obtained on the greater  ?                          curvature of the gastric body, on the lesser  ?                          curvature of the gastric body, on the greater  ?                          curvature of the gastric antrum and on the lesser  ?                          curvature of the gastric antrum with cold forceps  ?                          for histology ([facility] also requested to r/o persistent  ?                          H pylori). ?                          The cardia and gastric fundus were normal on  ?                          retroflexion. ?                          The examined duodenum was normal. ?Complications:            No immediate complications. ?Estimated Blood Loss:     Estimated blood loss was minimal. ?Impression:               - Mild Schatzki ring. Dilated. ?                          - Mildly edematous mucosa in the gastric body. ?                          - Normal examined duodenum. ?                          - Several biopsies were obtained  on the greater  ?                          curvature of the gastric body, on the lesser  ?                          curvature of the gastric body, on the greater  ?                          curvature of the gastric antrum and on the lesser  ?                          curvature of the gastric antrum. ?Recommendation:           -  Patient has a contact number available for  ?                          emergencies. The signs and symptoms of potential  ?                          delayed complications were discussed with the  ?                          patient. Return to normal activities tomorrow.  ?                          Written discharge instructions were provided to the  ?                          patient. ?                          - Resume previous diet. ?                          - Continue present medications. ?                          - Await pathology results. ?                          - Follow an antireflux regimen indefinitely. ?                          - Decrease alcohol use. ?Santiago Stenzel L. Myrtie Neitheranis, MD ?11/23/2021 10:30:00 AM ?This report has been signed electronically. ?

## 2021-11-23 NOTE — Progress Notes (Signed)
Called to room to assist during endoscopic procedure.  Patient ID and intended procedure confirmed with present staff. Received instructions for my participation in the procedure from the performing physician.  

## 2021-11-23 NOTE — Progress Notes (Signed)
Pt's states no medical or surgical changes since previsit or office visit. 

## 2021-11-23 NOTE — Patient Instructions (Signed)
YOU HAD AN ENDOSCOPIC PROCEDURE TODAY AT THE [facility]:   Refer to the procedure report that was given to you for any specific questions about what was found during the examination.  If the procedure report does not answer your questions, please call your gastroenterologist to clarify.  If you requested that your care partner not be given the details of your procedure findings, then the procedure report has been included in a sealed envelope for you to review at your convenience later.  YOU SHOULD EXPECT: Some feelings of bloating in the abdomen. Passage of more gas than usual.  Walking can help get rid of the air that was put into your GI tract during the procedure and reduce the bloating. If you had a lower endoscopy (such as a colonoscopy or flexible sigmoidoscopy) you may notice spotting of blood in your stool or on the toilet paper. If you underwent a bowel prep for your procedure, you may not have a normal bowel movement for a few days.  Please Note:  You might notice some irritation and congestion in your nose or some drainage.  This is from the oxygen used during your procedure.  There is no need for concern and it should clear up in a day or so.  SYMPTOMS TO REPORT IMMEDIATELY:    Following upper endoscopy (EGD)  Vomiting of blood or coffee ground material  New chest pain or pain under the shoulder blades  Painful or persistently difficult swallowing  New shortness of breath  Fever of 100F or higher  Black, tarry-looking stools  For urgent or emergent issues, a gastroenterologist can be reached at any hour by calling [phone]. Do not use MyChart messaging for urgent concerns.    DIET:  We do recommend a small meal at first, but then you may proceed to your regular diet.  Drink plenty of fluids but you should avoid alcoholic beverages for 24 hours.  ACTIVITY:  You should plan to take it easy for the rest of today and you should NOT DRIVE or use heavy machinery  until tomorrow (because of the sedation medicines used during the test).    FOLLOW UP: Our staff will call the number listed on your records 48-72 hours following your procedure to check on you and address any questions or concerns that you may have regarding the information given to you following your procedure. If we do not reach you, we will leave a message.  We will attempt to reach you two times.  During this call, we will ask if you have developed any symptoms of COVID 19. If you develop any symptoms (ie: fever, flu-like symptoms, shortness of breath, cough etc.) before then, please call [phone].  If you test positive for Covid 19 in the 2 weeks post procedure, please call and report this information to us.    If any biopsies were taken you will be contacted by phone or by letter within the next 1-3 weeks.  Please call us at [phone] if you have not heard about the biopsies in 3 weeks.    SIGNATURES/CONFIDENTIALITY: You and/or your care partner have signed paperwork which will be entered into your electronic medical record.  These signatures attest to the fact that that the information above on your After Visit Summary has been reviewed and is understood.  Full responsibility of the confidentiality of this discharge information lies with you and/or your care-partner. 

## 2021-11-27 ENCOUNTER — Telehealth: Payer: Self-pay | Admitting: *Deleted

## 2021-11-27 NOTE — Telephone Encounter (Signed)
No answer for post procedure call back. Unable to leave Voicemail. 

## 2021-11-28 ENCOUNTER — Encounter: Payer: Self-pay | Admitting: Gastroenterology

## 2021-12-04 ENCOUNTER — Telehealth: Payer: Self-pay | Admitting: Gastroenterology

## 2021-12-04 NOTE — Telephone Encounter (Signed)
Lm on vm for patient to return call 

## 2021-12-04 NOTE — Telephone Encounter (Signed)
Pt returned call. I have reviewed his pathology letter with him. He is aware that he will receive a letter in the mail with the same information. Pt is aware that he will need a repeat EGD in 1 year and will receive a letter closer to that time. Pt verbalized understanding and had no concerns at the end of the call. ?

## 2021-12-04 NOTE — Telephone Encounter (Addendum)
Inbound call from patient requesting results from procedure done on 4/13. Please advise.  ? ? ?[phone] ?

## 2023-11-22 IMAGING — DX DG CHEST 2V
2 series · 2 of 2 positions shown · non-contrast
Comparison: 03/23/2017

CLINICAL DATA: Cough

EXAM:
CHEST - 2 VIEW

[chest pa]
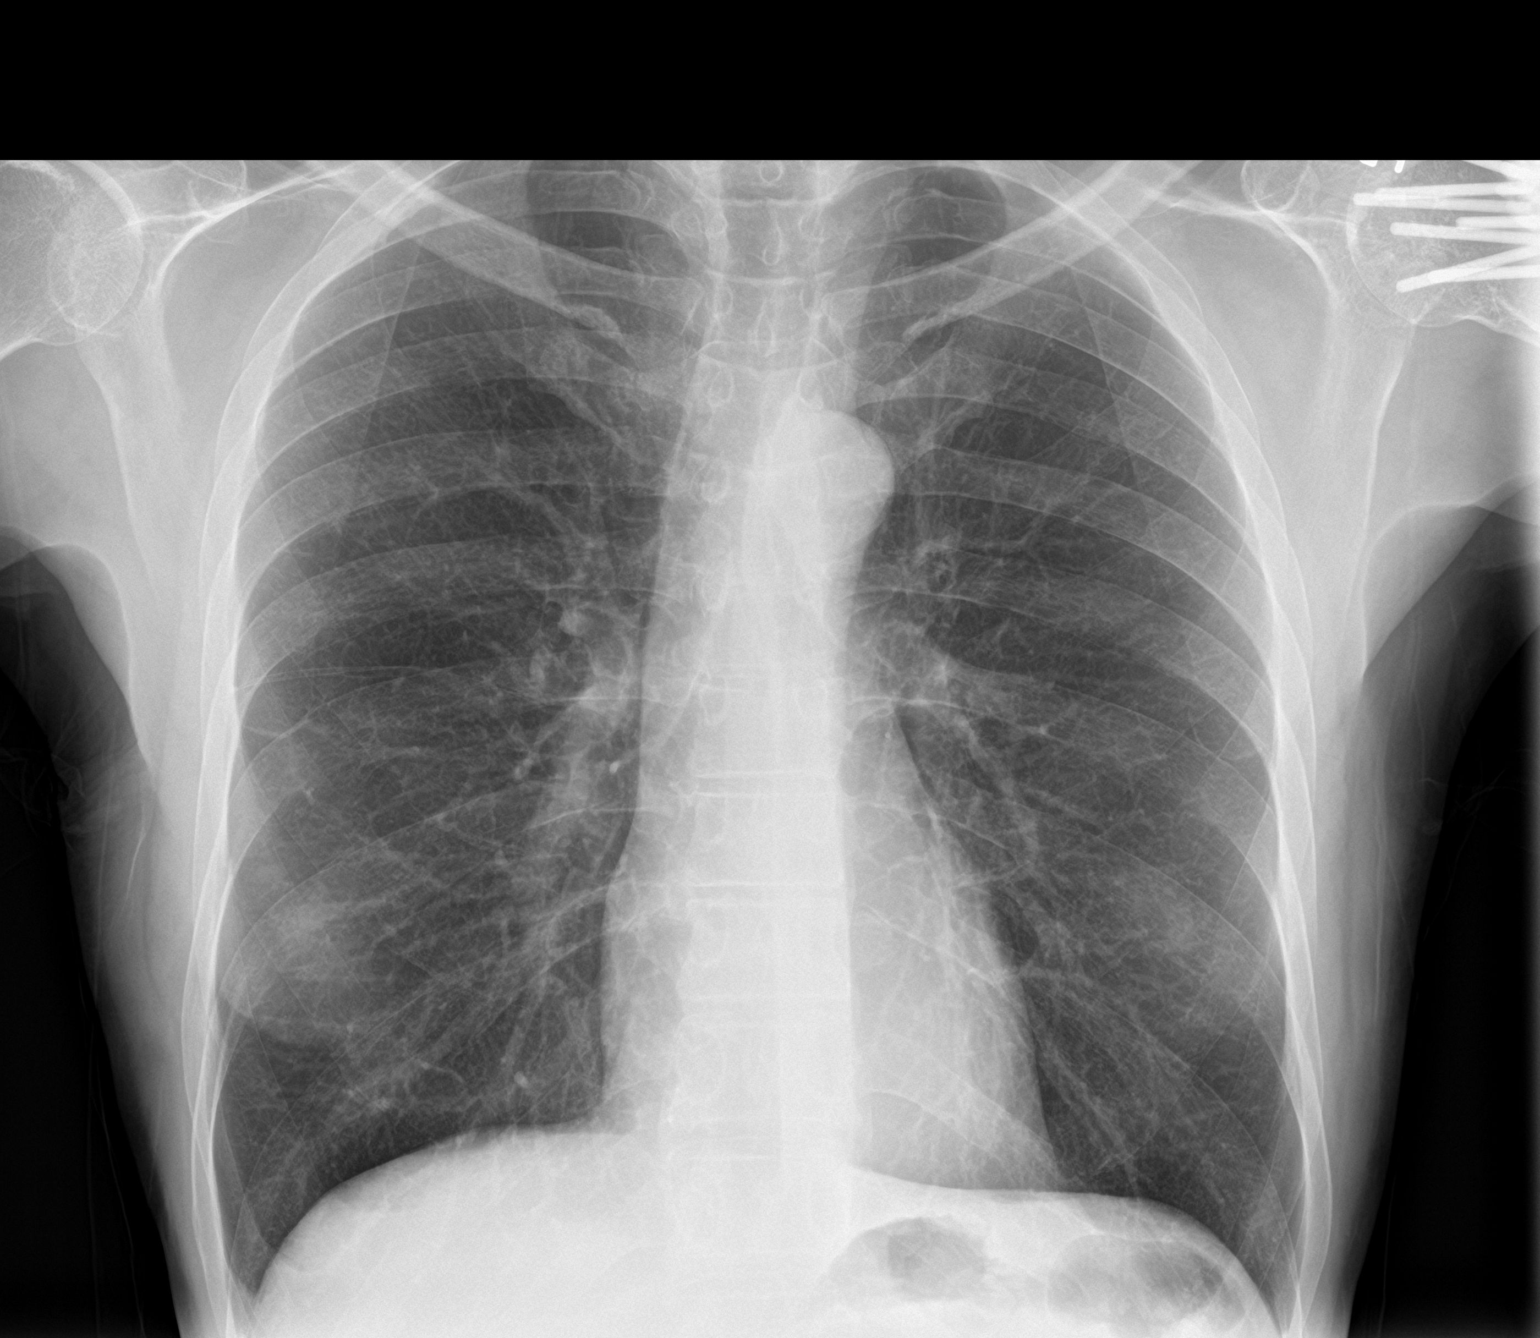

[chest lat]
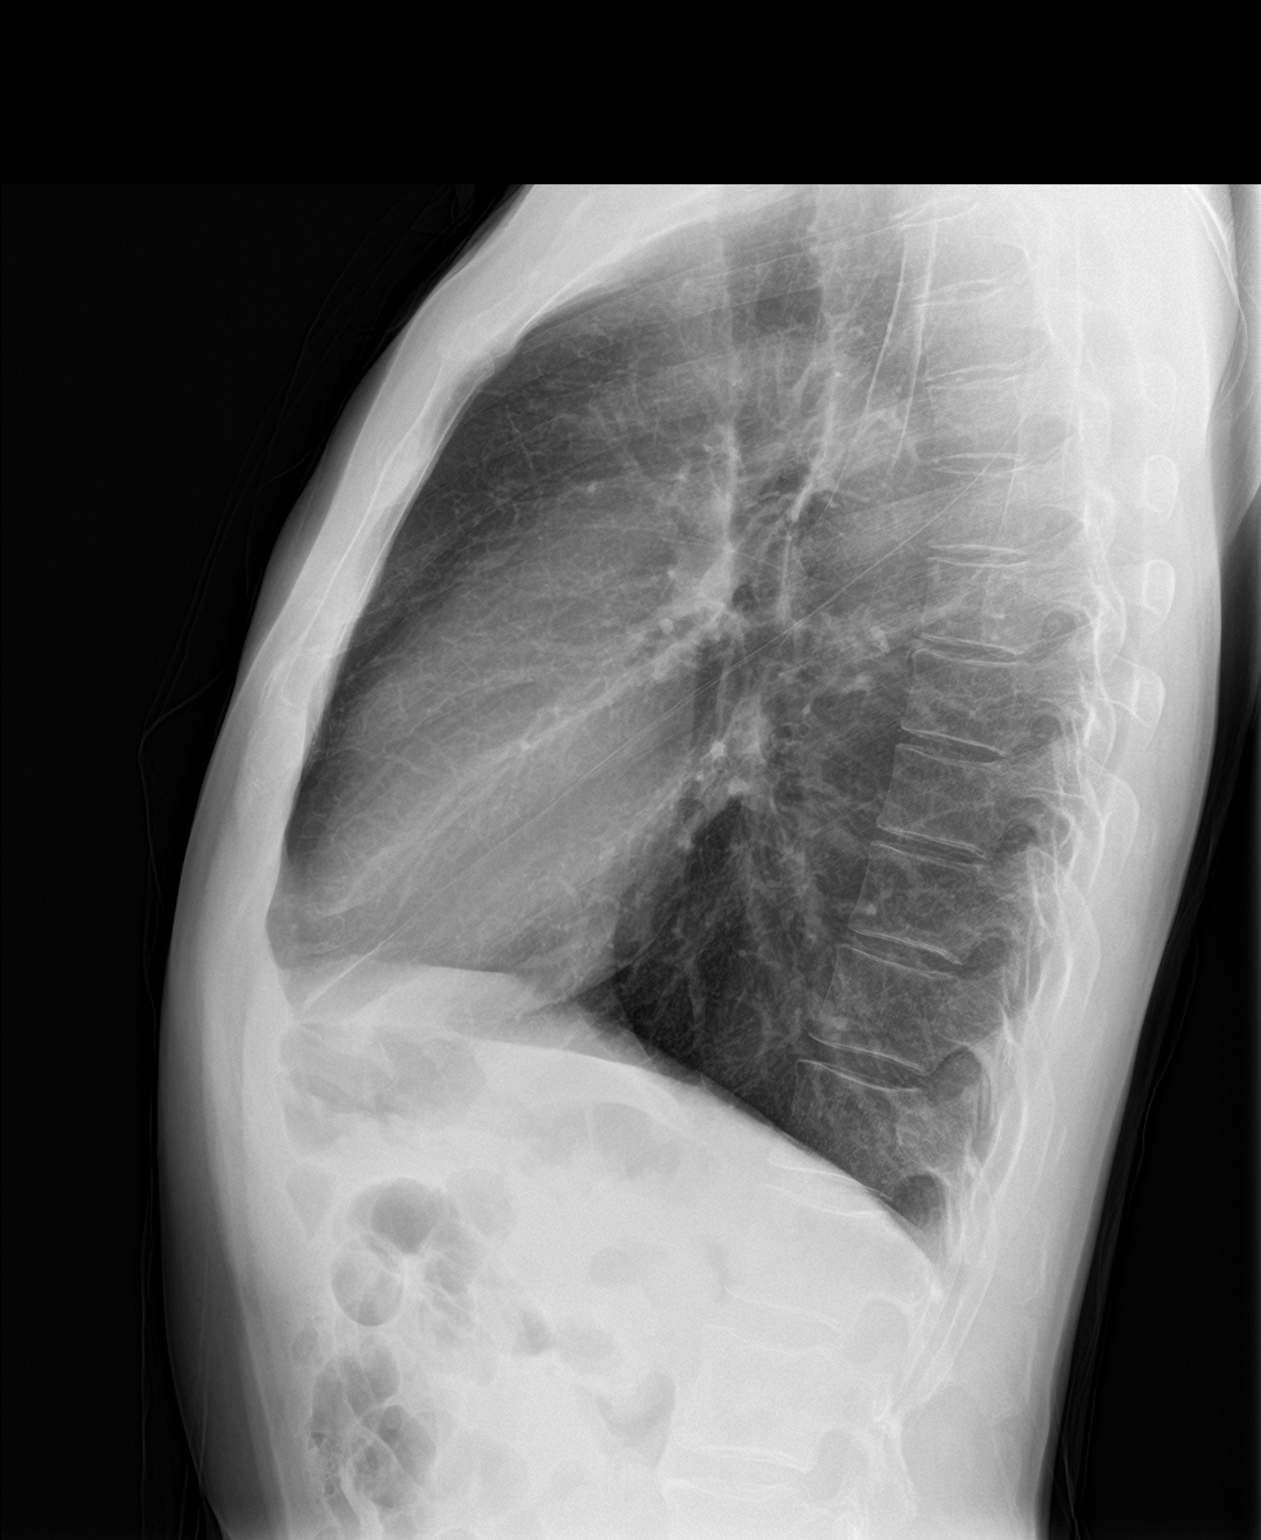

[2 of 2 positions shown; findings below may reference images not displayed]

FINDINGS: The heart size and mediastinal contours are within normal limits.
Both lungs are clear. Partially visualized fixating screws in the
left humerus
IMPRESSION: No active cardiopulmonary disease.

## 2024-08-25 ENCOUNTER — Encounter: Payer: Self-pay | Admitting: Gastroenterology

## 2024-09-18 ENCOUNTER — Encounter: Payer: Self-pay | Admitting: Gastroenterology

## 2024-09-18 ENCOUNTER — Telehealth: Payer: Self-pay | Admitting: Gastroenterology

## 2024-09-18 ENCOUNTER — Other Ambulatory Visit

## 2024-09-18 ENCOUNTER — Other Ambulatory Visit: Payer: Self-pay

## 2024-09-18 ENCOUNTER — Ambulatory Visit: Admitting: Gastroenterology

## 2024-09-18 ENCOUNTER — Ambulatory Visit: Payer: Self-pay | Admitting: Gastroenterology

## 2024-09-18 VITALS — BP 126/80 | HR 66 | Ht 71.0 in | Wt 117.0 lb

## 2024-09-18 DIAGNOSIS — R6881 Early satiety: Secondary | ICD-10-CM

## 2024-09-18 DIAGNOSIS — R1319 Other dysphagia: Secondary | ICD-10-CM

## 2024-09-18 DIAGNOSIS — K31A0 Gastric intestinal metaplasia, unspecified: Secondary | ICD-10-CM

## 2024-09-18 DIAGNOSIS — R634 Abnormal weight loss: Secondary | ICD-10-CM

## 2024-09-18 DIAGNOSIS — K222 Esophageal obstruction: Secondary | ICD-10-CM

## 2024-09-18 DIAGNOSIS — E875 Hyperkalemia: Secondary | ICD-10-CM

## 2024-09-18 DIAGNOSIS — R195 Other fecal abnormalities: Secondary | ICD-10-CM

## 2024-09-18 LAB — CBC WITH DIFFERENTIAL/PLATELET
Basophils Absolute: 0 10*3/uL (ref 0.0–0.1)
Basophils Relative: 1.1 % (ref 0.0–3.0)
Eosinophils Absolute: 0 10*3/uL (ref 0.0–0.7)
Eosinophils Relative: 0.4 % (ref 0.0–5.0)
HCT: 41.3 % (ref 39.0–52.0)
Hemoglobin: 14 g/dL (ref 13.0–17.0)
Lymphocytes Relative: 34.1 % (ref 12.0–46.0)
Lymphs Abs: 1.2 10*3/uL (ref 0.7–4.0)
MCHC: 33.9 g/dL (ref 30.0–36.0)
MCV: 95.4 fl (ref 78.0–100.0)
Monocytes Absolute: 0.4 10*3/uL (ref 0.1–1.0)
Monocytes Relative: 10.6 % (ref 3.0–12.0)
Neutro Abs: 1.9 10*3/uL (ref 1.4–7.7)
Neutrophils Relative %: 53.8 % (ref 43.0–77.0)
Platelets: 223 10*3/uL (ref 150.0–400.0)
RBC: 4.33 Mil/uL (ref 4.22–5.81)
RDW: 14.4 % (ref 11.5–15.5)
WBC: 3.6 10*3/uL — ABNORMAL LOW (ref 4.0–10.5)

## 2024-09-18 LAB — BASIC METABOLIC PANEL WITH GFR
BUN: 6 mg/dL (ref 6–23)
CO2: 30 meq/L (ref 19–32)
Calcium: 9.7 mg/dL (ref 8.4–10.5)
Chloride: 100 meq/L (ref 96–112)
Creatinine, Ser: 0.88 mg/dL (ref 0.40–1.50)
GFR: 91.62 mL/min
Glucose, Bld: 105 mg/dL — ABNORMAL HIGH (ref 70–99)
Potassium: 5.4 meq/L — ABNORMAL HIGH (ref 3.5–5.1)
Sodium: 136 meq/L (ref 135–145)

## 2024-09-18 MED ORDER — OMEPRAZOLE 40 MG PO CPDR: 40.0000 mg | DELAYED_RELEASE_CAPSULE | Freq: Every day | ORAL | 3 refills | Status: AC

## 2024-09-18 NOTE — Patient Instructions (Addendum)
 You have been scheduled for an endoscopy. Please follow written instructions given to you at your visit today.  If you use inhalers (even only as needed), please bring them with you on the day of your procedure.  If you take any of the following medications, they will need to be adjusted prior to your procedure:   DO NOT TAKE 7 DAYS PRIOR TO TEST- Trulicity (dulaglutide) Ozempic, Wegovy (semaglutide) Mounjaro, Zepbound (tirzepatide) Bydureon Bcise (exanatide extended release)  DO NOT TAKE 1 DAY PRIOR TO YOUR TEST Rybelsus (semaglutide) Adlyxin (lixisenatide) Victoza (liraglutide) Byetta (exanatide) ___________________________________________________________________________  Please go to the lab in the basement of our building to have lab work done as you leave today. Hit B for basement when you get on the elevator.  When the doors open the lab is on your left.  We will call you with the results. Thank you.  FOLLOW UP WITH YOUR PRIMARY CARE REGARDING TROUBLE URINATING AND YOUR CHRONIC COUGH  We have sent the following medications to your pharmacy for you to pick up at your convenience: OMEPRAZOLE  40 mg  _______________________________________________________  If your blood pressure at your visit was 140/90 or greater, please contact your primary care physician to follow up on this.  _______________________________________________________  If you are age 24 or older, your body mass index should be between 23-30. Your Body mass index is 16.32 kg/m. If this is out of the aforementioned range listed, please consider follow up with your Primary Care Provider.  If you are age 11 or younger, your body mass index should be between 19-25. Your Body mass index is 16.32 kg/m. If this is out of the aformentioned range listed, please consider follow up with your Primary Care Provider.   ________________________________________________________  The [facility] providers would like to  encourage you to use MYCHART to communicate with providers for non-urgent requests or questions.  Due to long hold times on the telephone, sending your provider a message by [facility] may be a faster and more efficient way to get a response.  Please allow 48 business hours for a response.  Please remember that this is for non-urgent requests.  _______________________________________________________  [facility] is using a team-based approach to care.  Your team is made up of your doctor and two to three APPS. Our APPS (Nurse Practitioners and Physician Assistants) work with your physician to ensure care continuity for you. They are fully qualified to address your health concerns and develop a treatment plan. They communicate directly with your gastroenterologist to care for you. Seeing the Advanced Practice Practitioners on your physician's team can help you by facilitating care more promptly, often allowing for earlier appointments, access to diagnostic testing, procedures, and other specialty referrals.   Due to recent changes in healthcare laws, you may see the results of your imaging and laboratory studies on MyChart before your provider has had a chance to review them.  We understand that in some cases there may be results that are confusing or concerning to you. Not all laboratory results come back in the same time frame and the provider may be waiting for multiple results in order to interpret others.  Please give us  48 hours in order for your provider to thoroughly review all the results before contacting the office for clarification of your results.

## 2024-09-18 NOTE — Telephone Encounter (Signed)
 As patient's procedure is not until 3/30 and he has progressive dysphagia, I'd like to see if we can get him in for a barium swallow prior. Ideally, would see if he could be scheduled for an earlier EGD date in the next 4 weeks. Please discuss with patient and let me know if he could do an earlier date, if not, I recommend barium swallow study prior, thank you.

## 2024-09-18 NOTE — Telephone Encounter (Signed)
 Left message on machine to call back   Overall labs look okay.  No evidence of infection or anemia.  White blood cell count slightly low.  The most recent labs I have for comparison are from 2023.  Recommend following up with PCP regarding low white count, should have labs repeated.   Has mild hyperkalemia with potassium 5.4.  This should be repeated.  Please see if he can come back for repeat BMP next week.  Should go to the ER if he has any development of chest pain, shortness of breath, weakness/paralysis.  Advise also to follow-up with PCP regarding potassium level. Kidney function is normal.   Check for earlier EGD if pt agrees or barium swallow.

## 2024-09-18 NOTE — Progress Notes (Signed)
 "  Dan Clark [id number] 1961-05-02   Chief Complaint: Trouble swallowing  Referring Provider: Durel Riggs, FNP Primary GI MD: Dr. Legrand  HPI: Dan Clark is a 64 y.o. male with past medical history of alcohol abuse, GERD, HTN who presents today for complaint of dysphagia.    Patient is referred by PCP for evaluation of dysphagia and early satiety.  Previously seen in office 10/16/2021 by Dr. Legrand for evaluation of GERD and dysphagia, nausea, early satiety, and unspecified weight loss.  History of smoking and heavy alcohol use.    He underwent EGD 11/23/2021 with finding of a mild Schatzki ring which was dilated, edematous mucosa in the gastric body.  Path showed chronic gastritis, focal intestinal metaplasia, negative for H. pylori.  Was advised to have a repeat upper endoscopy in 1 year for additional stomach biopsies.   Discussed the use of AI scribe software for clinical note transcription with the patient, who gave verbal consent to proceed.  History of Present Illness Dan Clark is a 64 year old male with prior esophageal Schatzki ring dilation and gastric intestinal metaplasia who presents for evaluation of progressive early satiety and dysphagia.  Dysphagia and Early Satiety: - Progressive early satiety and dysphagia for six to seven months - Fullness occurs quickly with meals, accompanied by decreased appetite - Estimated weight loss of 10-15 pounds over this period - Intermittent difficulty swallowing solids and liquids - Occasional regurgitation required after swallowing - No sensation of food impaction in the mid-esophagus - Prior upper endoscopy revealed a Schatzki ring, which was dilated with subsequent improvement in swallowing  Gastric Intestinal Metaplasia and Upper GI Symptoms: - Prior endoscopy demonstrated gastric inflammation and intestinal metaplasia - Infection testing was negative - Occasional nausea and vomiting, sometimes awakening him  from sleep with gagging and emesis  Gastroesophageal Reflux Symptoms: - Intermittent heartburn and acid reflux, diet-dependent - Not currently taking reflux medication  Melena and Bowel Habits: - Bowel movements generally normal - Occasional dark stools (not black/tarry) - No visible blood in stool - Recent blood work unremarkable per patient, reports unavailable  Respiratory Symptoms: - Chronic cough without shortness of breath or chest pain - Smokes cigars - Denies diagnosis of asthma or COPD (and no record of this in note from PCP)    Previous GI Procedures/Imaging   EGD 11/23/2021 - Mild Schatzki ring. Dilated.  - Mildly edematous mucosa in the gastric body.  - Normal examined duodenum.  - Several biopsies were obtained on the greater curvature of the gastric body, on the lesser curvature of the gastric body, on the greater curvature of the gastric antrum and on the lesser curvature of the gastric antrum. Path: Surgical [P], gastric antrum and gastric body CHRONIC GASTRITIS WITH LYMPHOID AGGREGATES, REACTIVE EPITHELIAL CHANGES AND FOCAL INTESTINAL METAPLASIA HELICOBACTER STAIN NEGATIVE ([facility], ADEQUATE CONTROL) NEGATIVE FOR DYSPLASIA AND CARCINOMA  Colonoscopy 03/26/2018 - Tortuous colon.  - The examination was otherwise normal on direct and retroflexion views.  - No specimens collected. - Recall 10 years  Past Medical History:  Diagnosis Date   Alcohol abuse    Allergy    Arthritis    Closed fracture of left proximal humerus 03/28/2017   GERD (gastroesophageal reflux disease)    Hypertension     Past Surgical History:  Procedure Laterality Date   COLONOSCOPY     MULTIPLE TOOTH EXTRACTIONS     ORIF HUMERUS FRACTURE Left 03/28/2017   ORIF HUMERUS FRACTURE Left 03/28/2017   Procedure: OPEN REDUCTION  INTERNAL FIXATION (ORIF) LEFT PROXIMAL HUMERUS FRACTURE;  Surgeon: Josefina Chew, MD;  Location: [facility] OR;  Service: Orthopedics;  Laterality: Left;    Current Outpatient  Medications  Medication Sig Dispense Refill   amLODipine  (NORVASC ) 10 MG tablet Take 1 tablet (10 mg total) by mouth daily. 30 tablet 5   thiamine 100 MG tablet Take 100 mg by mouth daily.     omeprazole  (PRILOSEC) 20 MG capsule Take 20 mg by mouth daily. (Patient not taking: Reported on 09/18/2024)     omeprazole  (PRILOSEC) 20 MG capsule Take 1 capsule (20 mg total) by mouth 2 (two) times daily for 14 days. (Patient not taking: Reported on 09/18/2024) 28 capsule 0   tamsulosin (FLOMAX) 0.4 MG CAPS capsule Take 0.4 mg by mouth daily. (Patient not taking: Reported on 09/18/2024)     No current facility-administered medications for this visit.    Allergies as of 09/18/2024   (No Known Allergies)    Family History  Problem Relation Age of Onset   Breast cancer Mother    Diabetes Sister    Stomach cancer Neg Hx    Rectal cancer Neg Hx    Pancreatic cancer Neg Hx    Esophageal cancer Neg Hx    Colon polyps Neg Hx    Colon cancer Neg Hx     Social History[1]   Review of Systems:    Constitutional: Reported unintentional weight loss of 10 to 15 pounds over the last 7 months.  No fever or chills Cardiovascular: No chest pain Respiratory: Chronic intermittent cough, no shortness of breath. Gastrointestinal: See HPI and otherwise negative Genitourinary: Slow urine stream/difficulty urinating (has been addressed by PCP)   Physical Exam:  Vital signs: BP 126/80   Pulse 66   Ht 5' 11 (1.803 m)   Wt 117 lb (53.1 kg)   BMI 16.32 kg/m   Wt Readings from Last 3 Encounters:  09/18/24 117 lb (53.1 kg)  11/23/21 126 lb (57.2 kg)  10/16/21 126 lb (57.2 kg)     Constitutional: Pleasant, thin appearing male in NAD, alert and cooperative Head:  Normocephalic and atraumatic.  Respiratory: Respirations even and unlabored. Lungs clear to auscultation bilaterally.  No wheezes, crackles, or rhonchi. Coughs intermittently during visit Cardiovascular:  Regular rate and rhythm. No murmurs. No  peripheral edema. Gastrointestinal:  Soft, nondistended, nontender. No rebound or guarding. Normal bowel sounds. No appreciable masses or hepatomegaly. Rectal:  Not performed.  Neurologic:  Alert and oriented x4;  grossly normal neurologically.  Skin:   Dry and intact without significant lesions or rashes. Psychiatric: Oriented to person, place and time. Demonstrates good judgement and reason without abnormal affect or behaviors.   Assessment/Plan:   Assessment & Plan Esophageal dysphagia History of Schatzki ring requiring dilation 2023 Gastric intestinal metaplasia GERD Early satiety Unintentional weight loss Dark stools (intermittent and denies black/tarry stools) Patient seen today for evaluation of early satiety and progressive dysphagia.  Has history of GERD, mild Schatzki ring which was previously dilated on EGD in 2023.  Biopsies of the stomach during that procedure revealed chronic gastritis and focal intestinal metaplasia, H. pylori negative.  He had been advised to have repeat endoscopy in a year with gastric mapping but has not been seen in our office since then. Endorses progressive dysphagia, early satiety, intermittent nausea and vomiting/regurgitation, and intermittent reflux/heartburn symptoms with associated unintentional weight loss of 10 to 15 pounds.  Occasionally seeing some dark brown stools but no black/tarry stools.  Having regular bowel movements,  no rectal bleeding. Reports having labs regularly with PCP, no recent labs sent with referral note.  Will check a CBC, BMP today. Patient has previously been prescribed omeprazole  but states he is not taking any reflux medications right now.  Has not seen his PCP since about October.  Will start him on omeprazole  40 mg daily.  Recommend EGD with possible dilation as well as biopsy/gastric mapping based on history of intestinal metaplasia. If nothing to explain his unintentional weight loss on endoscopy, consider CT  chest/abdomen/pelvis for further evaluation.  Patient had intermittent cough during visit today though was clear to auscultation, he denies any chest pain or shortness of breath.  Reports cough is chronic.  Does have history of cigarette smoking but denies any asthma or COPD diagnosis.  No record of this in referral note either. Advised patient to contact his PCP for further evaluation of his chronic cough with consideration for imaging.  Also advised to follow-up with PCP regarding his urinary symptoms.  Has had slow urine stream and is on Flomax but denies any improvement.  - Schedule EGD w/ possible dilation and gastric mapping. I thoroughly discussed the procedure with the patient to include nature of the procedure, alternatives, benefits, and risks (including but not limited to bleeding, infection, perforation, anesthesia/cardiac/pulmonary complications). Patient verbalized understanding and gave verbal consent to proceed with procedure.  - Start Omeprazole  40 mg daily - Labs: CBC, BMP - If no explanation for weight loss on endoscopy consider CT C/A/P - Follow up after procedure - Advised to follow up with PCP regarding chronic cough and difficulty urinating   Update: Patient chose 3/30 date for procedure. Would like to have him seen earlier. Will have team reach out and see if he could do an earlier date, otherwise will recommend at least a barium swallow in the interim.     Camie Furbish, PA-C [facility] 09/18/2024, 9:18 AM  Patient Care Team: Kristy Sharolyn Lenis, PA-C (Inactive) as PCP - General (Physician Assistant)       [1]  Social History Tobacco Use   Smoking status: Every Day    Types: Cigars   Smokeless tobacco: Never  Vaping Use   Vaping status: Some Days   Substances: Nicotine  Substance Use Topics   Alcohol use: Yes    Alcohol/week: 7.0 standard drinks of alcohol    Types: 7 Cans of beer per week    Comment: 1 to 2 beers daily   Drug use: Yes     Types: Marijuana    Comment: past cocaine & current marijuana use   "

## 2024-11-09 ENCOUNTER — Encounter: Admitting: Gastroenterology
